# Patient Record
Sex: Female | Born: 1955 | Race: White | Hispanic: No | Smoking: Current every day smoker
Health system: Southern US, Community
[De-identification: ages and names within clinical notes are randomized; demographics above are authoritative.]

## PROBLEM LIST (undated history)

## (undated) DIAGNOSIS — M1990 Unspecified osteoarthritis, unspecified site: Secondary | ICD-10-CM

## (undated) DIAGNOSIS — I10 Essential (primary) hypertension: Secondary | ICD-10-CM

## (undated) DIAGNOSIS — F32A Depression, unspecified: Secondary | ICD-10-CM

## (undated) DIAGNOSIS — B20 Human immunodeficiency virus [HIV] disease: Secondary | ICD-10-CM

## (undated) DIAGNOSIS — F1910 Other psychoactive substance abuse, uncomplicated: Secondary | ICD-10-CM

## (undated) DIAGNOSIS — F419 Anxiety disorder, unspecified: Secondary | ICD-10-CM

## (undated) DIAGNOSIS — K759 Inflammatory liver disease, unspecified: Secondary | ICD-10-CM

## (undated) DIAGNOSIS — I219 Acute myocardial infarction, unspecified: Secondary | ICD-10-CM

## (undated) DIAGNOSIS — J449 Chronic obstructive pulmonary disease, unspecified: Secondary | ICD-10-CM

## (undated) DIAGNOSIS — N189 Chronic kidney disease, unspecified: Secondary | ICD-10-CM

## (undated) DIAGNOSIS — E785 Hyperlipidemia, unspecified: Secondary | ICD-10-CM

## (undated) DIAGNOSIS — J439 Emphysema, unspecified: Secondary | ICD-10-CM

## (undated) DIAGNOSIS — K219 Gastro-esophageal reflux disease without esophagitis: Secondary | ICD-10-CM

## (undated) DIAGNOSIS — R569 Unspecified convulsions: Secondary | ICD-10-CM

## (undated) DIAGNOSIS — F329 Major depressive disorder, single episode, unspecified: Secondary | ICD-10-CM

## (undated) DIAGNOSIS — Z21 Asymptomatic human immunodeficiency virus [HIV] infection status: Secondary | ICD-10-CM

## (undated) DIAGNOSIS — J45909 Unspecified asthma, uncomplicated: Secondary | ICD-10-CM

## (undated) DIAGNOSIS — I739 Peripheral vascular disease, unspecified: Secondary | ICD-10-CM

## (undated) HISTORY — DX: Anxiety disorder, unspecified: F41.9

## (undated) HISTORY — PX: TONSILLECTOMY: SUR1361

## (undated) HISTORY — PX: TUBAL LIGATION: SHX77

## (undated) HISTORY — DX: Gastro-esophageal reflux disease without esophagitis: K21.9

## (undated) HISTORY — DX: Unspecified osteoarthritis, unspecified site: M19.90

## (undated) HISTORY — DX: Hyperlipidemia, unspecified: E78.5

## (undated) HISTORY — DX: Essential (primary) hypertension: I10

## (undated) HISTORY — DX: Acute myocardial infarction, unspecified: I21.9

## (undated) HISTORY — DX: Major depressive disorder, single episode, unspecified: F32.9

## (undated) HISTORY — DX: Unspecified convulsions: R56.9

## (undated) HISTORY — DX: Depression, unspecified: F32.A

## (undated) HISTORY — PX: MYRINGOTOMY WITH TUBE PLACEMENT: SHX5663

## (undated) HISTORY — DX: Emphysema, unspecified: J43.9

## (undated) HISTORY — DX: Chronic obstructive pulmonary disease, unspecified: J44.9

## (undated) HISTORY — DX: Other psychoactive substance abuse, uncomplicated: F19.10

## (undated) HISTORY — PX: STENT PLACEMENT VASCULAR (ARMC HX): HXRAD1737

## (undated) HISTORY — DX: Asymptomatic human immunodeficiency virus (hiv) infection status: Z21

## (undated) HISTORY — DX: Unspecified asthma, uncomplicated: J45.909

## (undated) HISTORY — DX: Human immunodeficiency virus (HIV) disease: B20

---

## 2013-06-12 DIAGNOSIS — G894 Chronic pain syndrome: Secondary | ICD-10-CM | POA: Insufficient documentation

## 2013-06-12 DIAGNOSIS — M545 Low back pain, unspecified: Secondary | ICD-10-CM | POA: Insufficient documentation

## 2013-06-12 DIAGNOSIS — F1911 Other psychoactive substance abuse, in remission: Secondary | ICD-10-CM | POA: Insufficient documentation

## 2013-06-12 DIAGNOSIS — G8929 Other chronic pain: Secondary | ICD-10-CM | POA: Insufficient documentation

## 2013-06-19 DIAGNOSIS — B20 Human immunodeficiency virus [HIV] disease: Secondary | ICD-10-CM

## 2013-08-26 ENCOUNTER — Other Ambulatory Visit: Payer: Self-pay | Admitting: Internal Medicine

## 2013-08-26 DIAGNOSIS — C220 Liver cell carcinoma: Secondary | ICD-10-CM

## 2013-09-04 DIAGNOSIS — B20 Human immunodeficiency virus [HIV] disease: Secondary | ICD-10-CM

## 2013-12-04 ENCOUNTER — Other Ambulatory Visit: Payer: Self-pay | Admitting: Infectious Diseases

## 2013-12-10 DIAGNOSIS — B20 Human immunodeficiency virus [HIV] disease: Secondary | ICD-10-CM

## 2013-12-16 LAB — T-HELPER CELLS CD4/CD8 %
% CD 4 Pos. Lymph.: 44.6 % (ref 30.8–58.5)
Absolute CD 4 Helper: 580 /uL (ref 359–1519)
Basophils Absolute: 0.1 10*3/uL (ref 0.0–0.2)
Basos: 1 %
CD3+CD4+ Cells/CD3+CD8+ Cells Bld: 0.98 (ref 0.92–3.72)
CD3+CD8+ CELLS # BLD: 590 /uL (ref 109–897)
CD3+CD8+ Cells NFr Bld: 45.4 % — ABNORMAL HIGH (ref 12.0–35.5)
Eos: 5 %
Eosinophils Absolute: 0.3 10*3/uL (ref 0.0–0.4)
HCT: 46.1 % (ref 34.0–46.6)
HEMOGLOBIN: 16.6 g/dL — AB (ref 11.1–15.9)
IMMATURE GRANULOCYTES: 0 %
Immature Grans (Abs): 0 10*3/uL (ref 0.0–0.1)
LYMPHS ABS: 1.3 10*3/uL (ref 0.7–3.1)
Lymphs: 23 %
MCH: 35.4 pg — ABNORMAL HIGH (ref 26.6–33.0)
MCHC: 36 g/dL — ABNORMAL HIGH (ref 31.5–35.7)
MCV: 98 fL — AB (ref 79–97)
MONOCYTES: 9 %
Monocytes Absolute: 0.5 10*3/uL (ref 0.1–0.9)
Neutrophils Absolute: 3.4 10*3/uL (ref 1.4–7.0)
Neutrophils Relative %: 62 %
PLATELETS: 218 10*3/uL (ref 150–379)
RBC: 4.69 x10E6/uL (ref 3.77–5.28)
RDW: 14.1 % (ref 12.3–15.4)
WBC: 5.6 10*3/uL (ref 3.4–10.8)

## 2013-12-16 LAB — COMPREHENSIVE METABOLIC PANEL
ALT: 7 IU/L (ref 0–32)
AST: 18 IU/L (ref 0–40)
Albumin/Globulin Ratio: 1.5 (ref 1.1–2.5)
Albumin: 4.5 g/dL (ref 3.5–5.5)
Alkaline Phosphatase: 67 IU/L (ref 39–117)
BUN/Creatinine Ratio: 11 (ref 9–23)
BUN: 16 mg/dL (ref 6–24)
CALCIUM: 10.3 mg/dL — AB (ref 8.7–10.2)
CO2: 23 mmol/L (ref 18–29)
Chloride: 100 mmol/L (ref 97–108)
Creatinine, Ser: 1.44 mg/dL — ABNORMAL HIGH (ref 0.57–1.00)
GFR calc Af Amer: 46 mL/min/{1.73_m2} — ABNORMAL LOW (ref >59)
GFR calc non Af Amer: 40 mL/min/{1.73_m2} — ABNORMAL LOW (ref >59)
GLOBULIN, TOTAL: 3 g/dL (ref 1.5–4.5)
Glucose: 86 mg/dL (ref 65–99)
Potassium: 4.4 mmol/L (ref 3.5–5.2)
Sodium: 139 mmol/L (ref 134–144)
Total Bilirubin: 0.3 mg/dL (ref 0.0–1.2)
Total Protein: 7.5 g/dL (ref 6.0–8.5)

## 2013-12-16 LAB — RPR: SYPHILIS RPR SCR: NONREACTIVE

## 2013-12-16 LAB — RNA, B-DNA, QUANT

## 2013-12-16 LAB — LIPID PANEL WITH LDL/HDL RATIO
Cholesterol, Total: 229 mg/dL — ABNORMAL HIGH (ref 100–199)
HDL: 52 mg/dL (ref >39)
LDL Calculated: 134 mg/dL — ABNORMAL HIGH (ref 0–99)
LDl/HDL Ratio: 2.6 ratio units (ref 0.0–3.2)
Triglycerides: 214 mg/dL — ABNORMAL HIGH (ref 0–149)
VLDL Cholesterol Cal: 43 mg/dL — ABNORMAL HIGH (ref 5–40)

## 2013-12-29 ENCOUNTER — Other Ambulatory Visit: Payer: Self-pay | Admitting: Infectious Diseases

## 2014-01-20 ENCOUNTER — Other Ambulatory Visit: Payer: Self-pay | Admitting: Infectious Diseases

## 2014-02-08 ENCOUNTER — Other Ambulatory Visit: Payer: Self-pay | Admitting: Infectious Diseases

## 2014-02-18 ENCOUNTER — Other Ambulatory Visit: Payer: Self-pay | Admitting: Infectious Diseases

## 2014-03-09 ENCOUNTER — Other Ambulatory Visit: Payer: Self-pay | Admitting: Infectious Diseases

## 2014-06-10 DIAGNOSIS — B20 Human immunodeficiency virus [HIV] disease: Secondary | ICD-10-CM

## 2014-11-12 DIAGNOSIS — B20 Human immunodeficiency virus [HIV] disease: Secondary | ICD-10-CM

## 2014-11-19 ENCOUNTER — Encounter: Payer: Self-pay | Admitting: Infectious Disease

## 2015-04-30 DIAGNOSIS — B20 Human immunodeficiency virus [HIV] disease: Secondary | ICD-10-CM

## 2015-05-12 ENCOUNTER — Ambulatory Visit: Payer: Self-pay | Admitting: Podiatrist

## 2015-05-17 ENCOUNTER — Ambulatory Visit: Payer: Self-pay | Admitting: Podiatry

## 2015-05-17 ENCOUNTER — Ambulatory Visit: Payer: Self-pay | Admitting: Podiatrist

## 2015-05-24 ENCOUNTER — Ambulatory Visit: Payer: Self-pay | Admitting: Podiatry

## 2015-12-13 DIAGNOSIS — B20 Human immunodeficiency virus [HIV] disease: Secondary | ICD-10-CM

## 2015-12-22 ENCOUNTER — Ambulatory Visit (INDEPENDENT_AMBULATORY_CARE_PROVIDER_SITE_OTHER): Payer: PPO

## 2015-12-22 ENCOUNTER — Ambulatory Visit (INDEPENDENT_AMBULATORY_CARE_PROVIDER_SITE_OTHER): Payer: PPO | Admitting: Sports Medicine

## 2015-12-22 ENCOUNTER — Encounter: Payer: Self-pay | Admitting: Sports Medicine

## 2015-12-22 DIAGNOSIS — B351 Tinea unguium: Secondary | ICD-10-CM

## 2015-12-22 DIAGNOSIS — M216X9 Other acquired deformities of unspecified foot: Secondary | ICD-10-CM | POA: Diagnosis not present

## 2015-12-22 DIAGNOSIS — Z720 Tobacco use: Secondary | ICD-10-CM | POA: Insufficient documentation

## 2015-12-22 DIAGNOSIS — Q828 Other specified congenital malformations of skin: Secondary | ICD-10-CM

## 2015-12-22 DIAGNOSIS — L603 Nail dystrophy: Secondary | ICD-10-CM | POA: Diagnosis not present

## 2015-12-22 DIAGNOSIS — M79673 Pain in unspecified foot: Secondary | ICD-10-CM

## 2015-12-22 DIAGNOSIS — M21619 Bunion of unspecified foot: Secondary | ICD-10-CM

## 2015-12-22 DIAGNOSIS — M2040 Other hammer toe(s) (acquired), unspecified foot: Secondary | ICD-10-CM

## 2015-12-22 NOTE — Progress Notes (Deleted)
   Subjective:    Patient ID: Sheri Peterson, female    DOB: [birth year], 60 y.o.   MRN: [id number]  HPI    Review of Systems  All other systems reviewed and are negative.      Objective:   Physical Exam        Assessment & Plan:

## 2015-12-23 NOTE — Progress Notes (Signed)
Patient ID: Sheri Peterson, female   DOB: February 08, 1956, 60 y.o.   MRN: [id number]  Subjective: Sheri Peterson is a 60 y.o. female patient who presents to office for evaluation of bilateral foot pain. Patient complains of pain at the lesions present at the balls of both feet and also pain at nails; big toes; Patient states that this has been going on for years. Reports that she has a history of seeing previous orthopedic and foot doctors before for this condition and is well aware that her foot type is the reason why she has these calluses, from excessive pressure. States that she knows that these calluses will never go away until her foot is surgically corrected. Patient states that these areas feel better after trimming; sometimes she occasionally trims herself, however, has not been able to do so over the last several months. Patient denies any other pedal complaints.   Review of Systems  All other systems reviewed and are negative.  Patient Active Problem List   Diagnosis Date Noted  . Current tobacco use 12/22/2015  . Chronic pain associated with significant psychosocial dysfunction 06/12/2013  . H/O drug abuse 06/12/2013  . Chronic LBP 06/12/2013   No current outpatient prescriptions on file prior to visit.   No current facility-administered medications on file prior to visit.   Allergies  Allergen Reactions  . Morphine And Related Rash     Objective:  General: Alert and oriented x3 in no acute distress  Dermatology: Keratotic lesions present Sub-met 1 and 5 bilateral with a central nucleated core noted suggestive of porokeratosis, mild Plantar xerosis bilateral heels, no webspace macerations, no ecchymosis bilateral, all nails x 10 are are thickened and dystrophic with the most dystrophy and deformity noted at bilateral hallux nails that appear to have a ram's horn appearance with associated pain.   Vascular: Dorsalis Pedis 2/4 and Posterior Tibial pedal pulses 1/4, Capillary Fill  Time 3 seconds, + pedal hair growth bilateral, no edema bilateral lower extremities, Temperature gradient within normal limits.  Neurology: Johney Maine sensation intact via light touch bilateral.  Musculoskeletal: Moderate tenderness with palpation at bilateral hallux nails and keratosis sites, Muscular strength 5/5 in all groups without pain or limitation on range of motion. Pes cavus foot type with associated hammertoes and bunion bilateral.   Xrays  Right and left foot: Mild decrease osseous mineralization. Severe rigid pes cavus foot type with associated bunion and hammertoes. There is mild increase in density of the plantar fascia. No acute fractures, dislocations, no foreign body, soft tissues within normal limits.   Assessment and Plan: Problem List Items Addressed This Visit    None    Visit Diagnoses    Foot pain, unspecified laterality    -  Primary    Relevant Orders    DG Foot 2 Views Left    DG Foot 2 Views Right    Dystrophic nail        Porokeratosis        Bunion        Hammer toe, unspecified laterality        Pes cavus, unspecified laterality          -Complete examination performed -Xrays reviewed -Discussed treatement options -Parred keratoic lesions x 4 using a chisel blade; treated the area with Salinocaine and moleskin; patient may remove in one day. -Bilateral hallux nails mechanically debrided and trimmed all smoothed without complication using sterile nipper. -Recommend good supportive shoes and inserts for foot type -Recommend daily skin  emollients (O'Keefe healthy feet cream) -Patient to return to office in 2 months or sooner if condition worsens.  Landis Martins, DPM

## 2015-12-29 HISTORY — PX: CORONARY ARTERY BYPASS GRAFT: SHX141

## 2016-02-09 ENCOUNTER — Other Ambulatory Visit: Payer: Self-pay

## 2016-02-09 DIAGNOSIS — I25700 Atherosclerosis of coronary artery bypass graft(s), unspecified, with unstable angina pectoris: Secondary | ICD-10-CM

## 2016-02-09 DIAGNOSIS — Z79899 Other long term (current) drug therapy: Secondary | ICD-10-CM

## 2016-02-09 DIAGNOSIS — R296 Repeated falls: Secondary | ICD-10-CM

## 2016-02-09 NOTE — Patient Outreach (Signed)
[facility]) Care Management  02/09/2016  Sheri Peterson May 31, 1956 [id number]  SUBJECTIVE: Telephone call to patient regarding primary MD referral. HIPAA verified with patient. Discussed and offered [facility] care management services. Patient verbally agreed to receive services.  Patient states she was in the hospital from December 27, 2015 to January 31, 2016 due to heart attack and CABG. Patient states she was readmitted December 27, 2015 after being in the hospital the beginning of January 2016.  Patient states she lives alone but has a sponsor from [employer] that assists her with her care. Patient states she was discharged from the hospital on January 31, 2016 without home health, a shower chair, or walker. Patient states, " I have a bad leg. I have difficulty walking and sometimes I have sea legs which causes me to fall." Patient reports she has fallen 2x without injury since discharge from the hospital on 01/31/16.   Patient states she had a follow up visit with her primary MD on 02/07/16. Patient states her doctor ordered therapy with [facility] health but she is unable to afford the co-pays.  Patient states she has follow up visits scheduled with Dr. Adolph Pollack on 02/16/16 and Dr. Deanna Artis on 02/15/16, her cardiologist and cardiac surgeon.  Patient states she was suppose to be discharged with an order for a CPAP machine but was not. Patient states she is having some shortness of breath. Patient states she does have her inhaler and her nebulizer machine with medication.  Patient states she is unable to afford her patch for smoking. Patient states she will continue to smoke until she is able to get back on the patch.  Patient states she has her sponsor and [employer] for transportation assistance.  Patient states she is unable to afford all of her medications. Patient states the hospital discontinued some of the medications she was on prior to being admitted on 12/27/15.  RNCM advised  patient to contact her doctor for unusual symptoms. RNCM reviewed with patient signs of heart attack and advised patient to contact 911 for heart attack symptoms   ASSESSMENT: Primary MD referral by Scherrie Gerlach, PA due to recent hospital discharge for Myocardial infarction and CABG, Medication management, and assistance with obtaining supplies.  PLAN: RNCM referred patient to community and pharmacy.  Quinn Plowman RN,BSN,CCM [facility] Telephonic  [phone]

## 2016-02-10 ENCOUNTER — Other Ambulatory Visit: Payer: Self-pay

## 2016-02-10 NOTE — Patient Outreach (Signed)
New referral: Placed call to [facility] and spoke with Lelan Pons. Requested office visit and discharge noted from [facility].    Lelan Pons will fax to office.   3:40 pm.  Placed call to patient. Explained purpose of call.  Offered home visit for 3/22 and patient declined and states she has MD appointments on 3/21 and 3/22.  Offered home visit for 3/23. Patient accepted.    Inquired about insurance coverage as patient had reported that she can not afford home health copay. Patient reports that she has health team advantage and a "DSS" card which reads Medicaid. Patient then states that she is short of breath and I would have to call her tomorrow. Patient hung up the phone.   3:50pm Placed call to [facility] practice to see what kind of medicaid. It was confirmed that patient has medicaid.    4:00 Placed call to [facility] health and spoke with Loren Racer [phone]  who states that medicaid does not cover co pay for health team advantage.  No orders were ever received on this [facility] from [facility].    PLAN: Will call patient tomorrow as requested and establish care plan. Unable to establish goals as patient had to get off the phone.  Tomasa Rand, RN, BSN, CEN [facility] [employer] [phone]

## 2016-02-11 ENCOUNTER — Other Ambulatory Visit: Payer: Self-pay

## 2016-02-11 NOTE — Patient Outreach (Signed)
Follow up transition of care call.  Patient had requested a call back after she was unable to complete phone call yesterday. Placed call to patient who reports that she is doing okay.  States that she is not able to afford co pays for home health. Reviewed options with patient of choosing a different home health agency. Patient reports that she does not think she needs home health right now. Patient states that she needs a shower chair, ( currently using her brothers) and a walker.  Patient reports that she has all her medications except fenobritate and crestor.  Patient reports that she gets her prescriptions filled at [facility] on [address]. Patient states she will call and see if she has refills if so she will go pick up her meds. Patient states that she has money for her medication copays.  Patient unable to review each medication or doses at this time. Easily winded when talking for a long time on the phone.  Patient admits to going to emergency department last night with high blood pressure. States that she waited for 3 hours and decided to go home. Reports that her blood pressure was better when she left.  Patient reports that she thinks she needs CPAP at night. However does not have an order and has not had a sleep study. Encouraged patient to discuss with cardiologist next week at scheduled office visit.   Patient reports that she currently feels like she has everything she needs until my home visit on 02/17/2016.   Plan: Will see patient for home visit on 02/17/2016 as planned. Placed call to Lelan Pons at Dr. Nyra Capes office and provided update about home health and meds. Also spoke with Deanne Coffer about patient report of missing meds and her willingness to problem solve this herself. Patient to call back for concerns.   Tomasa Rand, RN, BSN, CEN [facility] [employer] [phone]

## 2016-02-17 ENCOUNTER — Other Ambulatory Visit: Payer: Self-pay

## 2016-02-17 VITALS — BP 118/80 | HR 80 | Resp 24 | Ht 67.0 in | Wt 139.0 lb

## 2016-02-17 DIAGNOSIS — Z951 Presence of aortocoronary bypass graft: Secondary | ICD-10-CM

## 2016-02-18 ENCOUNTER — Other Ambulatory Visit: Payer: Self-pay

## 2016-02-18 NOTE — Patient Outreach (Signed)
Sheri Peterson) Care Management   02/18/2016  Sheri Peterson [birth year] [id number]  Sheri Peterson is an 60 y.o. female 10:30 am  Arrived for scheduled home visit. Eduard Clos [facility] social worker included in home visit Subjective: Patient reports that she feels like she is doing well since hospital discharge on 02/01/2016. Reports that she does not remember a lot about her admission and why she was admitted for 2 months. Patient reports that she went to the [facility] Emergency Department with chest pain and was transferred to [facility] where she was told she had a heart attack. Reports that she had open heart surgery on 3 vessels.  Reports her sponsor was staying with her for a little while after discharge but now she is living alone. Reports social support is from her "Group Support and sponsor"  Patient reports that she does not socialize with any of her neighbors.  SMOKING: Patient reports that she stopped smoking 5 days ago. Reports calling the Quit Line and she got some patches. Patient plan to continue to not smoke. FOOD: Reports that she has food stamps and visits local food pantries if needed. Denies any concern for food securities. COPD: Patient reports that she was told by cardiology that she needed home oxygen. Reports that she did not get a prescription for oxygen. DME: Patient reports that she got a prescription for a shower chair and a rolling walker with seat. Reports that she was told she had to pay a 20 % copay.  Reports she does not have money for copay. MEDS: Patient reports that she has all her medications that are prescribed. Reports that her medications are cheap. Reports that she understands her medications. TRANSPORTATION: Reports that she uses [employer] for transportation needs. She knows who to call to get her rides scheduled. Reports that she is struggling going to see cardiac surgeon because [employer] does not go to [facility] when her MD is in the  office.   CABG: Reports chronic pain. Reports incision is well healed. Has follow up planned with cardiology. ( Dr. Joycelyn Rua [facility] physicians   [phone]. Last cardiology visit on 02/15/2016  Objective:  Awake and alert. Easily engaged. Home free of tripping hazards. Grab bar noted in shower. Large shower chair( patients brothers) in shower. Shower chair is too big for patients shower. Short of breath noted when patient ambulated to kitchen. She had to sit and rest.  Filed Vitals:   02/17/16 1058  BP: 118/80  Pulse: 80  Resp: 24  Height: 1.702 m (5' 7" )  Weight: 139 lb (63.05 kg)  SpO2: 95%   Review of Systems  Constitutional: Negative.   HENT: Negative.   Eyes: Negative.   Respiratory: Positive for cough, sputum production and shortness of breath.        Reports shortness of breath with ambulation  Cardiovascular: Negative for leg swelling.  Gastrointestinal: Negative.   Genitourinary: Negative.   Musculoskeletal: Positive for back pain and joint pain.  Skin: Negative.   Neurological: Negative.   Endo/Heme/Allergies: Negative.   Psychiatric/Behavioral: Positive for substance abuse. The patient is nervous/anxious.        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 Reports being anxious when taking a shower.    Physical Exam  Constitutional: She is oriented to person, place, and time. She appears well-developed and well-nourished.  Cardiovascular: Normal rate and normal heart sounds.   Respiratory: Effort normal and breath sounds normal. No respiratory distress. She has  no wheezes.  No distress. Lungs clear. Chest incisions well healed.   GI: Soft. Bowel sounds are normal.  Musculoskeletal: Normal range of motion. She exhibits no edema.  Lower back tender with palpation  Neurological: She is alert and oriented to person, place, and time.  Skin: Skin is warm and dry.  Incisions well healed. Feet without open sores or lesions.  Psychiatric: She has a normal mood and  affect. Her behavior is normal. Judgment and thought content normal.    Current Medications:   Current Outpatient Prescriptions  Medication Sig Dispense Refill  . abacavir-lamiVUDine (EPZICOM) 600-300 MG tablet Take by mouth. Reported on 02/09/2016    . acetaminophen (TYLENOL) 500 MG tablet Take 500 mg by mouth every 6 (six) hours as needed.    Marland Kitchen albuterol (PROVENTIL) (2.5 MG/3ML) 0.083% nebulizer solution Take 2.5 mg by nebulization every 6 (six) hours as needed for Wheezing.    Marland Kitchen aspirin EC 81 MG tablet Take 81 mg by mouth daily.    . diphenhydrAMINE (BENADRYL) 25 MG tablet Take 25 mg by mouth every 6 (six) hours as needed.    . doxepin (SINEQUAN) 50 MG capsule Reported on 02/09/2016  2  . FLUoxetine (PROZAC) 20 MG capsule TK ONE C PO  D  2  . HORIZANT 600 MG TBCR TK 1 T PO BID  6  . lisinopril (PRINIVIL,ZESTRIL) 40 MG tablet Take 40 mg by mouth 2 (two) times daily.    . metoprolol (LOPRESSOR) 50 MG tablet Take 50 mg by mouth 2 (two) times daily.    . nicotine (NICODERM CQ - DOSED IN MG/24 HOURS) 21 mg/24hr patch Place 21 mg onto the skin daily.    Marland Kitchen oxyCODONE-acetaminophen (ROXICET) 5-325 MG/5ML solution Take by mouth every 4 (four) hours as needed.    Marland Kitchen PROAIR HFA 108 (90 Base) MCG/ACT inhaler Reported on 02/09/2016  3  . ranitidine (ZANTAC) 150 MG tablet TK 1 T PO BID  1  . rosuvastatin (CRESTOR) 10 MG tablet Reported on 02/09/2016  2  . TRIUMEQ 600-50-300 MG tablet TK 1 T PO QD  11  . zolpidem (AMBIEN) 5 MG tablet Take 2.5 mg by mouth at bedtime as needed.    Marland Kitchen amLODipine (NORVASC) 10 MG tablet Reported on 02/17/2016  3  . amLODipine (NORVASC) 10 MG tablet Take by mouth. Reported on 02/17/2016    . Buprenorphine 15 MCG/HR PTWK Place onto the skin. Reported on 02/17/2016    . ciprofloxacin (CIPRO) 500 MG tablet Reported on 02/17/2016  0  . clopidogrel (PLAVIX) 75 MG tablet Reported on 02/17/2016  3  . dolutegravir (TIVICAY) 50 MG tablet Reported on 02/17/2016    . doxepin (SINEQUAN) 25 MG  capsule Take by mouth. Reported on 02/17/2016    . fenofibrate 160 MG tablet Reported on 02/17/2016  3  . fenofibrate 160 MG tablet Take by mouth. Reported on 02/17/2016    . FLUoxetine (PROZAC) 20 MG capsule Take by mouth. Reported on 02/17/2016    . metroNIDAZOLE (FLAGYL) 500 MG tablet Reported on 02/17/2016  0  . NUCYNTA ER 50 MG TB12 Reported on 02/17/2016  0  . ondansetron (ZOFRAN-ODT) 4 MG disintegrating tablet Reported on 02/17/2016  0  . perphenazine (TRILAFON) 4 MG tablet Reported on 02/17/2016    . pregabalin (LYRICA) 150 MG capsule Take by mouth. Reported on 02/17/2016    . promethazine-dextromethorphan (PROMETHAZINE-DM) 6.25-15 MG/5ML syrup Reported on 02/17/2016  0  . WELCHOL 625 MG tablet Reported on 02/17/2016  0  No current facility-administered medications for this visit.    Functional Status:   In your present state of health, do you have any difficulty performing the following activities: 02/17/2016  Hearing? Y  Vision? Y  Difficulty concentrating or making decisions? N  Walking or climbing stairs? Y  Dressing or bathing? Y  Doing errands, shopping? Y  Preparing Food and eating ? N  Using the Toilet? N  In the past six months, have you accidently leaked urine? Y  Do you have problems with loss of bowel control? Y  Managing your Medications? N  Managing your Finances? N  Housekeeping or managing your Housekeeping? N    Fall/Depression Screening:    PHQ 2/9 Scores 02/17/2016  PHQ - 2 Score 0   Fall Risk  02/17/2016 02/09/2016  Falls in the past year? Yes Yes  Number falls in past yr: 2 or more 2 or more  Injury with Fall? No No  Risk Factor Category  High Fall Risk High Fall Risk  Risk for fall due to : Impaired mobility;Impaired balance/gait History of fall(s)  Follow up Falls prevention discussed -    Assessment:   (1) reviewed purpose of [facility] program. Provided new patient packet. Provided [facility] calendar. Provided my contact card. Consent obtained. (2) no advanced  directive (3) Please note updated medication list. Denies problems with medications. (4) Stopped smoking 5 days ago. (5) Chronic pain (6) Lack of DME  ie shower chair and rolling walker. (7) Increased risk for falls. (8) transportation concern. Reports DSS case worker is Judyann Munson  [phone].  Plan:  (1) Consent scanned into chart. (2) Provided advanced directive packet during home visit. (3) encouraged patient to take all meds as prescribed. (4) Patient will continue to use nicotine patches. Encouraged patient to continue to stop smoking to improve her health. (5) Encouraged patient to keep scheduled [facility]th. (6) reviewed with patient options for DME company. Patient chooses [facility]. Placed call to [facility] home [facility] and provided patient information. Placed call to [facility] physicans and spoke with Gabby. Requested oxygen order, and DME orders to be faxed to [place] at [phone].  Patient informed to expect call.  (7) Reviewed fall precautions with patient. Working on DME. (8) referral placed for [facility] social worker who was present during this home visit and is familiar with patients needs.  Goal setting during home visit. Primary goal is to avoid readmissions. Patient will continue to get weekly outreach phone calls. Patient will call me sooner if needed. Placed call to Dr. Nyra Capes office and provide verbal update about patient. Will fax this note.    THN CM Care Plan Problem One        Most Recent Value   Care Plan Problem One  Recent admission related to heart problems/ CABG   Role Documenting the Problem One  Care Management Turner for Problem One  Active   THN Long Term Goal (31-90 days)  Patient will report no readmissions in the next 31 days.   THN Long Term Goal Start Date  02/11/16   Interventions for Problem One Long Term Goal  Encouraged patient to continue to follow up  with MD. encouraged patient to call MD for any problems   THN CM Short Term Goal #1 (0-30 days)  Patient will call pharmacy about her medications in the next 6 days   THN CM Short Term Goal #1 Start Date  02/11/16   THN CM Short Term Goal #1 Met Date  02/17/16 Barrie Folk met]   Interventions for Short Term Goal #1  encouraged patient to call pharmacy. if problems with medication enocuraged patient to call nurse case manager or primary MD. Infomred primary Md of medication concerns.   THN CM Short Term Goal #2 (0-30 days)  Patient will discuss need for CPAP with cardiologist as planned on 02/16/2016   [facility] CM Short Term Goal #2 Start Date  02/11/16   [facility] CM Short Term Goal #2 Met Date  02/17/16 Barrie Folk met]   Interventions for Short Term Goal #2  Enocuraged patient to discuss concerns with MD at planned office visit. Will follow up with patient at home visit planned for 02/17/2016   [facility] CM Short Term Goal #3 (0-30 days)  Patient will have needed DME in the next 7 days.   THN CM Short Term Goal #3 Start Date  02/10/16   Interventions for Short Tern Goal #3  Reviewed written prescription, placed call to [facility]. Placed call to cardiology and requested order to be faxed to [facility].     Tomasa Rand, RN, BSN, CEN [facility] [employer] [phone]

## 2016-02-18 NOTE — Patient Outreach (Signed)
Care Coordination: Spoke with Dahlia Client with [facility] home [facility].  Reports no orders received. Placed call to [facility] Card[facility]. Spoke with Janace Hoard again and requested order to be faxed to [facility]. Reports she will send message again. Note this is the second request. I requested a call back to confirm orders were faxed.  Tomasa Rand, RN, BSN, CEN [facility] [employer] [phone]

## 2016-02-22 ENCOUNTER — Other Ambulatory Visit: Payer: Self-pay

## 2016-02-22 NOTE — Patient Outreach (Signed)
Transition of care call:  Patient's friend Augustine Radar left me a voicemail  Requesting a call back.  Called patient back and spoke with patient. Patient very difficult to understand on the phone. Speech very different than when I completed home visit last week. Patient reports frequent falls in the last 2 days. Patient requested I speak with Augustine Radar.  I spoke with Augustine Radar who reports patient has had slurred speech, leaning to the right and 3 falls in the last 2 days.  PLAN: I encouraged patient to go to the emergency department for evaluation of change in condition. I placed call to MD office and inform Lelan Pons about symptoms. I requested Augustine Radar to call me back with update.  Will continue to follow.  Tomasa Rand, RN, BSN, CEN [facility] [employer] [phone]

## 2016-02-23 ENCOUNTER — Ambulatory Visit: Payer: PPO | Admitting: Sports Medicine

## 2016-02-24 ENCOUNTER — Other Ambulatory Visit: Payer: Self-pay

## 2016-02-24 ENCOUNTER — Other Ambulatory Visit: Payer: Self-pay | Admitting: *Deleted

## 2016-02-24 NOTE — Patient Outreach (Signed)
Transition of care call: Received voicemail from patient.  Placed called to patient who reports that she was diagnosed with a "mini stroke" reports she is feeling much better. Reports that she does not have her walker or shower chair yet and states that she has not heard from [address].  Placed call to [facility] and they report that no order was ever received. Placed call to [facility]) cardiology. Left a message for Chrisy. Requested order be re faxed to [facility].   Plan: Awaiting call back from cardiology,          Encouraged patient to make an appointment to be seen by primary MD.  4:50pm  Call back to advanced and confirm no order received today.               Placed call to patient and provided update.  Will contact patient when I hear back from MD.  Again encouraged patient to follow up with primary MD. She states she will call tomorrow for an appointment.  Tomasa Rand, RN, BSN, CEN [facility] [employer] [phone]

## 2016-02-24 NOTE — Patient Outreach (Signed)
Transition of care: Placed call to patient for follow up on ED visit. I reviewed [facility] EMR and noted that patient was discharged back home. No answer. Left a message requesting a call back.   PLAN: Will await a call back.  Tomasa Rand, RN, BSN, CEN [facility] [employer] [phone]

## 2016-02-25 ENCOUNTER — Other Ambulatory Visit: Payer: Self-pay

## 2016-02-25 NOTE — Patient Outreach (Signed)
Care Coordination: Received call back from [facility] cardiology, [place] who states order was faxed to [facility].  11:10am  Placed call to advanced and confirmed order was received. Advanced reports that they need demographics, diagnosis code, insurance information. I offered to supply this information but was told request was sent to MD office.  11:15am  Placed call back to [facility] cardiology and left a message for Chrissy to please take care of this asap as patient has been waiting a long time. Awaiting a call back.  Tomasa Rand, RN, BSN, CEN [facility] [employer] [phone]

## 2016-02-28 ENCOUNTER — Other Ambulatory Visit: Payer: Self-pay | Admitting: Pharmacist

## 2016-02-28 NOTE — Patient Outreach (Signed)
[place] [facility]) Care Management  [place]   02/28/2016  Sheri Peterson 01-04-1956 [id number]  Subjective: Sheri Peterson is a 60yo who was referred to [place] for medication review and medication assistance.  I received a return phone call from patient.  Patient reports filling all prescriptions at [facility] on [address] in [place], [place].  Patient denies any difficulty affording prescription medications.  Patient reports prescription copays are usually less than $4.  Patient states the only medication she had difficulty affording is nicotine patches and patient reports she was able to get nicotine patches from 1-800-QUITNOW.  Patient confirms the [place] program mailed her 2 weeks of patches.  Reviewed purpose and proper use of nicotine patches with patient.    Patient had recent hospitalization from 12/27/15 to 02/01/16 at [facility].  The following medications were initiated at discharge: aspirin 81 mg, lisinopril, metoprolol tartrate, nicotine patches, oxycodone-acetaminophen, and zolpidem.  The following medications were discontinued at discharge: amlodipine and clopidogrel.    Following discharge, patient had PCP visit on 02/07/16 and cardiology visit on 02/15/16.  Medication reconciliation completed using Care Everywhere records from hospital discharge on 01/31/16, medication list from patient's PCP visit on 02/07/16 (in Media tab) and cardiology visit on 02/15/16 (Care Everywhere).  I reviewed all medications with patient and counseled patient on purpose and proper use of medications.    Objective:   Encounter Medications: Outpatient Encounter Prescriptions as of 02/28/2016  Medication Sig Note  . acetaminophen (TYLENOL) 500 MG tablet Take 500 mg by mouth every 6 (six) hours as needed. 02/28/2016: Takes if needed.   Marland Kitchen albuterol (PROVENTIL) (2.5 MG/3ML) 0.083% nebulizer solution Take 2.5 mg by nebulization every 6 (six) hours as needed  for Wheezing. 02/28/2016: Has on hand if needed  . aspirin EC 81 MG tablet Take 81 mg by mouth daily.   . diphenhydrAMINE (BENADRYL) 25 MG tablet Take 25 mg by mouth every 6 (six) hours as needed. 02/28/2016: Has on hand if needed for itching  . doxepin (SINEQUAN) 50 MG capsule Reported on 02/09/2016 02/17/2016: Taking one at bedtime.  Marland Kitchen FLUoxetine (PROZAC) 20 MG capsule TK ONE C PO  D   . HORIZANT 600 MG TBCR TK 1 T PO BID   . lisinopril (PRINIVIL,ZESTRIL) 40 MG tablet Take 40 mg by mouth 2 (two) times daily.   . metoprolol (LOPRESSOR) 50 MG tablet Take 50 mg by mouth 2 (two) times daily.   . nicotine (NICODERM CQ - DOSED IN MG/24 HOURS) 21 mg/24hr patch Place 21 mg onto the skin daily.   Marland Kitchen oxyCODONE-acetaminophen (ROXICET) 5-325 MG/5ML solution Take by mouth every 4 (four) hours as needed. 02/28/2016: Reports taking as needed  . PROAIR HFA 108 (90 Base) MCG/ACT inhaler Reported on 02/09/2016   . ranitidine (ZANTAC) 150 MG tablet TK 1 T PO BID   . rosuvastatin (CRESTOR) 10 MG tablet Reported on 02/28/2016   . TRIUMEQ 600-50-300 MG tablet TK 1 T PO QD   . zolpidem (AMBIEN) 5 MG tablet Take 2.5 mg by mouth at bedtime as needed. 02/28/2016: Reports taking 1/2 of a 5 mg tablet as needed.   . [DISCONTINUED] abacavir-lamiVUDine (EPZICOM) 600-300 MG tablet Take by mouth. Reported on 02/09/2016 12/22/2015: Received from: [facility]  . [DISCONTINUED] amLODipine (NORVASC) 10 MG tablet Reported on 02/28/2016 02/28/2016: Provider discontinued  . [DISCONTINUED] amLODipine (NORVASC) 10 MG tablet Take by mouth. Reported on 02/28/2016 12/22/2015: Received from: [facility]  . [DISCONTINUED]  Buprenorphine 15 MCG/HR PTWK Place onto the skin. Reported on 02/28/2016 12/22/2015: Received from: [facility]  . [DISCONTINUED] ciprofloxacin (CIPRO) 500 MG tablet Reported on 02/28/2016 12/22/2015: Received from: External Pharmacy  . [DISCONTINUED] clopidogrel (PLAVIX) 75 MG tablet Reported on 02/28/2016 12/22/2015: Received from: External  Pharmacy  . [DISCONTINUED] dolutegravir (TIVICAY) 50 MG tablet Reported on 02/28/2016 12/22/2015: Received from: [facility]  . [DISCONTINUED] doxepin (SINEQUAN) 25 MG capsule Take by mouth. Reported on 02/17/2016 12/22/2015: Received from: [facility]  . [DISCONTINUED] fenofibrate 160 MG tablet Reported on 02/28/2016 12/22/2015: Received from: External Pharmacy  . [DISCONTINUED] fenofibrate 160 MG tablet Take by mouth. Reported on 02/28/2016 12/22/2015: Received from: [facility]  . [DISCONTINUED] FLUoxetine (PROZAC) 20 MG capsule Take by mouth. Reported on 02/17/2016 12/22/2015: Received from: [facility]  . [DISCONTINUED] metroNIDAZOLE (FLAGYL) 500 MG tablet Reported on 02/28/2016 12/22/2015: Received from: External Pharmacy  . [DISCONTINUED] NUCYNTA ER 50 MG TB12 Reported on 02/28/2016 12/22/2015: Received from: External Pharmacy  . [DISCONTINUED] ondansetron (ZOFRAN-ODT) 4 MG disintegrating tablet Reported on 02/28/2016 12/22/2015: Received from: External Pharmacy  . [DISCONTINUED] perphenazine (TRILAFON) 4 MG tablet Reported on 02/28/2016 12/22/2015: Received from: [facility]  . [DISCONTINUED] pregabalin (LYRICA) 150 MG capsule Take by mouth. Reported on 02/28/2016 12/22/2015: Received from: [facility]  . [DISCONTINUED] promethazine-dextromethorphan (PROMETHAZINE-DM) 6.25-15 MG/5ML syrup Reported on 02/28/2016 12/22/2015: Received from: External Pharmacy  . [DISCONTINUED] [facility] 625 MG tablet Reported on 02/28/2016 12/22/2015: Received from: External Pharmacy   No facility-administered encounter medications on file as of 02/28/2016.    Functional Status: In your present state of health, do you have any difficulty performing the following activities: 02/17/2016  Hearing? Y  Vision? Y  Difficulty concentrating or making decisions? N  Walking or climbing stairs? Y  Dressing or bathing? Y  Doing errands, shopping? Y  Preparing Food and eating ? N  Using the Toilet? N  In the past six months, have you  accidently leaked urine? Y  Do you have problems with loss of bowel control? Y  Managing your Medications? N  Managing your Finances? N  Housekeeping or managing your Housekeeping? N    Fall/Depression Screening: PHQ 2/9 Scores 02/17/2016  PHQ - 2 Score 0    Assessment:  Drugs sorted by system:  Neurologic/Psychologic: doxepin, fluoxetine, zolpidem  Cardiovascular: aspirin, lisinopirl, metoprolol, rosuvastatin  Pulmonary/Allergy: albuterol HFA and nebulizer, diphenhydramine  Gastrointestinal: ranitidine  Endocrine: none  Renal: none  Topical: nicotine patch   Pain: acetaminophen, gabapentin, oxycodone-acetaminophen  Vitamins/Minerals: none  Infectious Diseases: Triumeq  Miscellaneous: none   Duplications in therapy: none noted  Gaps in therapy: none noted   Medications to avoid in the elderly: N/A, patient <65yo  Drug interactions:  Fluoxetine is an inhibitor of CYP2D6 which may decrease the metabolism of the following medications:  - Doxepin:  Dose reduction of doxepin may be needed during concurrent administration of fluoxetine.  Additional plasma drug concentration and clinical monitoring are indicated.    - Metoprolol:  Co-administration of fluoxetine and metoprolol may increase the risk of bradycardia and hypotension.  Close heart rate and blood pressure monitoring are indicated when fluoxetine and metoprolol are coadministered. Alternatively, consider use of SSRIs that have a lower potential to inhibit CYP2D6 (e.g. citalopram, escitalopram, sertraline) in patients receiving metoprolol tartrate.  Other issues noted:  - Medication adherence with rosuvastatin.  Patient reports adherence with all medications except rosuvastatin.  Patient reports she is currently out of the medication and when she tried to get it refilled from  [place] there were no refills available.  Patient report [facility] contacted her provider for a refill request but she has not  heard back from [facility] about refill.  I called [facility] and confirmed that patient now has a refill available for rosuvastatin.  I updated patient on this information.  Patient confirms she will go pick up rosuvastatin prescription today.  I advised patient to call [facility] next time to check on status of prescriptions instead of waiting for them to call her back.  Patient voices understanding.   - Long acting inhaler for COPD.  Per review of PCP records, medication list includes Breo Ellipta and Incruse Ellipta.  Patient reports she is not currently taking these medications as they were not on her hospital discharge medication list.  Patient reports she still has the inhalers but there are no more doses in the Breo Ellipta inhaler and only 3 doses in the Incruse Ellipta inhaler.     Plan: 1.  I will send the findings of my medication review to patient's PCP, Scherrie Gerlach.  Will request for clarification from PCP whether patient should resume using Breo Ellipta and Incruse Ellipta inhalers.   2.  Will close pharmacy program as no further intervention needed.  Will update [place].     Elisabeth Most, Pharm.D. Pharmacy Resident [place] [phone]

## 2016-02-28 NOTE — Patient Outreach (Signed)
Error

## 2016-02-28 NOTE — Patient Outreach (Signed)
[facility]) Care Management  02/28/2016  Sheri Peterson September 14, 1956 [id number]   Sheri Peterson is a 60yo who was referred to [place] for medication review and medication assistance.  I made outreach call to patient to review her medications and assess for medication assistance needs.  There was no answer.  I left a HIPAA compliant voicemail for patient to return my call.  I will make outreach call on 02/29/16 if patient does not return my call today.    Elisabeth Most, Pharm.D. Pharmacy Resident [place] [phone]

## 2016-02-29 ENCOUNTER — Encounter: Payer: Self-pay | Admitting: *Deleted

## 2016-02-29 ENCOUNTER — Encounter: Payer: Self-pay | Admitting: Pharmacist

## 2016-02-29 NOTE — Patient Outreach (Signed)
[facility]) Care Management  02/29/2016  Sheri Peterson [birth year] [id number]  CSW contacted patient to assess need for CSW involvement/visit.Referred to CSW for transportation and Medicaid.  Patient reports to CSW that she already has full Medicaid. She also reports that she is linked with [facility] for transportation- she has her Sponsor taking her to an appointment that she has in [place] (cardiologist) which was at a time and on a day that [facility] cannot go out of town/county.  Patient denies any other psychosocial needs or concerns. She is still attempting to get some DME and reports that she is linked with [facility] who is helping with DME and transportation needs.  CSW will close case at this time. Patient aware and encouraged to call CSW if needs arise.  CSW will notify [facility] RN CM of case closure.     Eduard Clos, MSW, [place] Worker  [place] [phone]

## 2016-03-06 ENCOUNTER — Other Ambulatory Visit: Payer: Self-pay

## 2016-03-06 NOTE — Patient Outreach (Signed)
Transition of care call: Spoke with patient who reports that she had follow up with MD today. Reports that she did get a call from [facility] and will have her shower chair and walker delivered today.   Reports that she has been referred to [facility] and is waiting on assessment.  Reports that she is feeling much better. Voice sounds strong on the phone.  PLAN: Will continue transition of care calls weekly.  Tomasa Rand, RN, BSN, CEN [facility] [employer] [phone]

## 2016-03-08 ENCOUNTER — Ambulatory Visit (INDEPENDENT_AMBULATORY_CARE_PROVIDER_SITE_OTHER): Payer: PPO | Admitting: Sports Medicine

## 2016-03-08 ENCOUNTER — Encounter: Payer: Self-pay | Admitting: Sports Medicine

## 2016-03-08 VITALS — BP 98/62 | HR 63 | Resp 16

## 2016-03-08 DIAGNOSIS — M216X9 Other acquired deformities of unspecified foot: Secondary | ICD-10-CM

## 2016-03-08 DIAGNOSIS — L853 Xerosis cutis: Secondary | ICD-10-CM

## 2016-03-08 DIAGNOSIS — M21619 Bunion of unspecified foot: Secondary | ICD-10-CM

## 2016-03-08 DIAGNOSIS — M2040 Other hammer toe(s) (acquired), unspecified foot: Secondary | ICD-10-CM | POA: Diagnosis not present

## 2016-03-08 DIAGNOSIS — M79673 Pain in unspecified foot: Secondary | ICD-10-CM | POA: Diagnosis not present

## 2016-03-08 DIAGNOSIS — Q828 Other specified congenital malformations of skin: Secondary | ICD-10-CM | POA: Diagnosis not present

## 2016-03-08 NOTE — Progress Notes (Signed)
Patient ID: Sheri Peterson, female   DOB: [birth year], 60 y.o.   MRN: [id number]  Subjective: Sheri Peterson is a 60 y.o. female patient who returns to office for evaluation of bilateral foot pain secondary to callus and dry skin. Reports that she has been in the hospital, had a triple bypass surgery and has just recently been released. States that her skin has started to peel and scale and wanted to have this evaluated. States that she has not been able to pick up the skin cream that I recommend at last visit because of her hospitalization. Patient denies any other pedal complaints.    Patient Active Problem List   Diagnosis Date Noted  . Current tobacco use 12/22/2015  . Chronic pain associated with significant psychosocial dysfunction 06/12/2013  . H/O drug abuse 06/12/2013  . Chronic LBP 06/12/2013   Current Outpatient Prescriptions on File Prior to Visit  Medication Sig Dispense Refill  . acetaminophen (TYLENOL) 500 MG tablet Take 500 mg by mouth every 6 (six) hours as needed.    Marland Kitchen albuterol (PROVENTIL) (2.5 MG/3ML) 0.083% nebulizer solution Take 2.5 mg by nebulization every 6 (six) hours as needed for Wheezing.    Marland Kitchen aspirin EC 81 MG tablet Take 81 mg by mouth daily.    . diphenhydrAMINE (BENADRYL) 25 MG tablet Take 25 mg by mouth every 6 (six) hours as needed.    . doxepin (SINEQUAN) 50 MG capsule Reported on 02/09/2016  2  . FLUoxetine (PROZAC) 20 MG capsule TK ONE C PO  D  2  . HORIZANT 600 MG TBCR TK 1 T PO BID  6  . lisinopril (PRINIVIL,ZESTRIL) 40 MG tablet Take 40 mg by mouth 2 (two) times daily.    . metoprolol (LOPRESSOR) 50 MG tablet Take 50 mg by mouth 2 (two) times daily.    . nicotine (NICODERM CQ - DOSED IN MG/24 HOURS) 21 mg/24hr patch Place 21 mg onto the skin daily.    Marland Kitchen oxyCODONE-acetaminophen (ROXICET) 5-325 MG/5ML solution Take by mouth every 4 (four) hours as needed.    Marland Kitchen PROAIR HFA 108 (90 Base) MCG/ACT inhaler Reported on 02/09/2016  3  . ranitidine (ZANTAC) 150 MG  tablet TK 1 T PO BID  1  . rosuvastatin (CRESTOR) 10 MG tablet Reported on 02/28/2016  2  . TRIUMEQ 600-50-300 MG tablet TK 1 T PO QD  11  . zolpidem (AMBIEN) 5 MG tablet Take 2.5 mg by mouth at bedtime as needed.     No current facility-administered medications on file prior to visit.   Allergies  Allergen Reactions  . Morphine And Related Rash     Objective:  General: Alert and oriented x3 in no acute distress  Dermatology: Keratotic lesions present Sub-met 1 and 5 bilateral with a central nucleated core noted suggestive of porokeratosis, mild Plantar xerosis bilateral heels, no webspace macerations, no ecchymosis bilateral, all nails x 10 are are thickened and dystrophic but well manicured.   Vascular: Dorsalis Pedis 2/4 and Posterior Tibial pedal pulses 1/4, Capillary Fill Time 3 seconds, + pedal hair growth bilateral, no edema bilateral lower extremities, Temperature gradient within normal limits.  Neurology: Sheri Peterson sensation intact via light touch bilateral.  Musculoskeletal: Moderate tenderness with palpation at keratosis sites, Muscular strength 5/5 in all groups without pain or limitation on range of motion. Pes cavus foot type with associated hammertoes and bunion bilateral.   Assessment and Plan: Problem List Items Addressed This Visit    None  Visit Diagnoses    Porokeratosis    -  Primary    Dry skin        Foot pain, unspecified laterality        Bunion        Hammer toe, unspecified laterality        Pes cavus, unspecified laterality          -Complete examination performed -Discussed treatement options -Parred keratoic lesions x 4 using a chisel blade without incident. -Recommend good supportive shoes and inserts for foot type -Recommend daily skin emollients (O'Keefe healthy feet cream) -Patient to return to office as needed or sooner if condition worsens.  Landis Martins, DPM

## 2016-03-13 ENCOUNTER — Ambulatory Visit: Payer: PPO

## 2016-03-13 ENCOUNTER — Ambulatory Visit: Payer: Self-pay

## 2016-03-14 ENCOUNTER — Other Ambulatory Visit: Payer: Self-pay

## 2016-03-14 NOTE — Patient Outreach (Signed)
Final transition of care call/case closure: Spoke with patient who reports that she is doing well. Denies any new problems or concerns. Reports that [facility] assessment has been completed and she hopes to start with the [facility] center in May.  Discussed with patient that she has successfully completed transition of care program. Patient reports that she did follow up with [facility] and her walker and shower chair have been shipped. Patient denies any new needs or concerns and is in agreement to case closure at this time.  PLAN: Successfully completed transition of care. Will close case and notify MD and patient by letter.  THN CM Care Plan Problem One        Most Recent Value   Care Plan Problem One  Recent admission related to heart problems/ CABG   Role Documenting the Problem One  Care Management [place] for Problem One  Active   THN Long Term Goal (31-90 days)  Patient will report no readmissions in the next 31 days.   THN Long Term Goal Start Date  02/11/16   [facility] Long Term Goal Met Date  03/14/16 Barrie Folk met]   Interventions for Problem One Long Term Goal  Encouraged patient to continue to reports any concerns to MD.   [facility] CM Short Term Goal #1 (0-30 days)  Patient will call pharmacy about her medications in the next 6 days   THN CM Short Term Goal #1 Start Date  02/11/16   [facility] CM Short Term Goal #1 Met Date  02/17/16 Barrie Folk met]   Interventions for Short Term Goal #1  encouraged patient to call pharmacy. if problems with medication enocuraged patient to call nurse case manager or primary MD. Infomred primary Md of medication concerns.   [facility]N CM Short Term Goal #2 (0-30 days)  Patient will discuss need for CPAP with cardiologist as planned on 02/16/2016   [facility] CM Short Term Goal #2 Start Date  02/11/16   [facility] CM Short Term Goal #2 Met Date  02/17/16 Barrie Folk met]   Interventions for Short Term Goal #2  Enocuraged patient to discuss concerns with MD at planned office  visit. Will follow up with patient at home visit planned for 02/17/2016   [facility] CM Short Term Goal #3 (0-30 days)  Patient will have needed DME in the next 7 days.   [facility] CM Short Term Goal #3 Start Date  02/10/16   [facility] CM Short Term Goal #3 Met Date  03/06/16 [goal met. Reports getting equipment today]   Interventions for Short Tern Goal #3  placed call to [facility] and cardiology office.      Tomasa Rand, RN, BSN, CEN [facility] [employer] [phone]

## 2016-04-10 ENCOUNTER — Other Ambulatory Visit: Payer: Self-pay | Admitting: Family Medicine

## 2016-04-10 DIAGNOSIS — M79604 Pain in right leg: Secondary | ICD-10-CM

## 2016-04-10 DIAGNOSIS — M79605 Pain in left leg: Principal | ICD-10-CM

## 2016-04-14 DIAGNOSIS — B20 Human immunodeficiency virus [HIV] disease: Secondary | ICD-10-CM | POA: Diagnosis not present

## 2016-04-27 ENCOUNTER — Other Ambulatory Visit: Payer: PPO

## 2016-05-08 ENCOUNTER — Encounter: Payer: Self-pay | Admitting: Surgery

## 2016-05-15 ENCOUNTER — Ambulatory Visit (INDEPENDENT_AMBULATORY_CARE_PROVIDER_SITE_OTHER): Payer: Medicare (Managed Care) | Admitting: Surgery

## 2016-05-15 ENCOUNTER — Encounter: Payer: Self-pay | Admitting: Surgery

## 2016-05-15 VITALS — BP 97/50 | HR 89 | Ht 67.0 in | Wt 137.0 lb

## 2016-05-15 DIAGNOSIS — I70213 Atherosclerosis of native arteries of extremities with intermittent claudication, bilateral legs: Secondary | ICD-10-CM | POA: Diagnosis not present

## 2016-05-15 NOTE — Progress Notes (Signed)
[facility] [place]  Patient name: Sheri Peterson MRN: [id number] DOB: May 10, 1956 Sex: female  REFERRING PHYSICIAN: [place]: PAD  HPI: Sheri Peterson is a 60 y.o. female, who is referred for evaluation of bilateral claudication the patient states that she will fall down because her legs give out with minimal activity.  She is requiring a walker currently.  She does not have any rest pain or nonhealing wounds.  Her most recent ABIs are 0.44 on the right and 0.39 on the left.  CT angiogram shows a focal occlusion in the left iliac artery and a long segment occlusion of the right iliac artery.  The patient reports having undergone percutaneous intervention in the past in another state.  She appears to have occlusion of the distal left SFA as well as a short segment stenosis within the right SFA.  The patient suffers from HIV which is currently undetectable on anti-retroviral therapy.  She also has hepatitis C.  She takes a statin for hypercholesterolemia.  She is on multiple medications for blood pressure.  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  Past Medical History  Diagnosis Date  . Anxiety   . Arthritis   . Asthma   . COPD (chronic obstructive pulmonary disease) ([place])   . Depression   . Emphysema of lung ([place])   . GERD (gastroesophageal reflux disease)   . HIV infection ([place])   . Hyperlipidemia   . Hypertension   . Myocardial infarction ([place])   . Seizures ([place])   . Substance abuse     No family history on file.  SOCIAL HISTORY: Social History   Social History  . Marital Status: Widowed    Spouse Name: N/A  . Number of Children: N/A  . Years of Education: N/A   Occupational History  . Not on file.   Social History Main Topics  . Smoking status: Former Smoker -- 0.50 packs/day for 46 years    Types: Cigarettes     Quit date: 02/12/2016  . Smokeless tobacco: Never Used  . Alcohol Use: No  . Drug Use: No  . Sexual Activity: Not on file   Other Topics Concern  . Not on file   Social History Narrative    Allergies  Allergen Reactions  . Morphine And Related Rash    Current Outpatient Prescriptions  Medication Sig Dispense Refill  . acetaminophen (TYLENOL) 500 MG tablet Take 500 mg by mouth every 6 (six) hours as needed.    Marland Kitchen albuterol (PROVENTIL) (2.5 MG/3ML) 0.083% nebulizer solution Take 2.5 mg by nebulization every 6 (six) hours as needed for Wheezing.    Marland Kitchen aspirin EC 81 MG tablet Take 81 mg by mouth daily.    . diphenhydrAMINE (BENADRYL) 25 MG tablet Take 25 mg by mouth every 6 (six) hours as needed.    . doxepin (SINEQUAN) 50 MG capsule Reported on 02/09/2016  2  . FLUoxetine (PROZAC) 20 MG capsule TK ONE C PO  D  2  . HORIZANT 600 MG TBCR TK 1 T PO BID  6  . metoprolol (LOPRESSOR) 50 MG tablet Take 25 mg by mouth 2 (two) times daily.     . nicotine (NICODERM CQ - DOSED IN MG/24 HOURS) 21 mg/24hr patch Place 21 mg onto the skin daily.    Marland Kitchen PROAIR HFA 108 (90 Base) MCG/ACT inhaler Reported on 02/09/2016  3  .  ranitidine (ZANTAC) 150 MG tablet TK 1 T PO BID  1  . rosuvastatin (CRESTOR) 10 MG tablet Reported on 02/28/2016  2  . TRIUMEQ 600-50-300 MG tablet TK 1 T PO QD  11  . clopidogrel (PLAVIX) 75 MG tablet Take 75 mg by mouth daily. Reported on 05/15/2016  0  . lisinopril (PRINIVIL,ZESTRIL) 40 MG tablet Take 40 mg by mouth 2 (two) times daily. Reported on 05/15/2016    . oxyCODONE-acetaminophen (ROXICET) 5-325 MG/5ML solution Take by mouth every 4 (four) hours as needed. Reported on 05/15/2016    . zolpidem (AMBIEN) 5 MG tablet Take 2.5 mg by mouth at bedtime as needed. Reported on 05/15/2016     No current facility-administered medications for this visit.    REVIEW OF SYSTEMS:  [X]  denotes positive finding, [ ]  denotes negative finding Cardiac  Comments:  Chest pain or chest pressure:  x   Shortness of breath upon exertion:    Short of breath when lying flat:    Irregular heart rhythm:        Vascular    Pain in calf, thigh, or hip brought on by ambulation: x   Pain in feet at night that wakes you up from your sleep:  x   Blood clot in your veins:    Leg swelling:  x       Pulmonary    Oxygen at home:    Productive cough:     Wheezing:         Neurologic    Sudden weakness in arms or legs:     Sudden numbness in arms or legs:     Sudden onset of difficulty speaking or slurred speech:    Temporary loss of vision in one eye:     Problems with dizziness:         Gastrointestinal    Blood in stool:     Vomited blood:         Genitourinary    Burning when urinating:     Blood in urine:        Psychiatric    Major depression:         Hematologic    Bleeding problems:    Problems with blood clotting too easily:        Skin    Rashes or ulcers:        Constitutional    Fever or chills:      PHYSICAL EXAM: Filed Vitals:   05/15/16 0916  BP: 97/50  Pulse: 89  Height: 5\' 7"  (1.702 m)  Weight: 137 lb (62.143 kg)  SpO2: 99%    GENERAL: The patient is a well-nourished female, in no acute distress. The vital signs are documented above. CARDIAC: There is a regular rate and rhythm.  VASCULAR: Nonpalpable pedal pulse PULMONARY: There is good air exchange bilaterally without wheezing or rales. ABDOMEN: Soft and non-tender with normal pitched bowel sounds.  MUSCULOSKELETAL: There are no major deformities or cyanosis. NEUROLOGIC: No focal weakness or paresthesias are detected. SKIN: There are no ulcers or rashes noted. PSYCHIATRIC: The patient has a normal affect.  DATA:  ABI: 0.44 on the right.  0.39 on the left. CT angiogram: Occluded left iliac artery.  Occluded right common and external iliac artery.  Bilateral SFA occlusion  MEDICAL ISSUES: The patient is suffering from significant lower extremity vascular insufficiency.  She has significant  inflow disease.  We have discussed proceeding with angiography via a left femoral approach to evaluate the  stenosis/occlusion in the left common iliac artery.  I will try to revascularize this area so that she would be a candidate for a left to right femoral-femoral bypass graft.  If I cannot open her left iliac, I would need to consider an aortobifemoral bypass graft.  Her catheterization is been scheduled for Wednesday, July 5.  I will keep her on aspirin and Plavix until then.  She will need to come off her Plavix for her surgical procedure.  The risks and benefits of the procedure were discussed with the patient all her questions were answered.  I also discussed the case with Dr. Narda Bonds, MD [facility] [phone] Pager [phone]

## 2016-05-19 ENCOUNTER — Other Ambulatory Visit: Payer: Self-pay

## 2016-05-31 ENCOUNTER — Encounter (HOSPITAL_COMMUNITY): Admission: RE | Payer: Self-pay | Source: Ambulatory Visit

## 2016-05-31 ENCOUNTER — Other Ambulatory Visit: Payer: Self-pay

## 2016-05-31 ENCOUNTER — Ambulatory Visit (HOSPITAL_COMMUNITY): Admission: RE | Admit: 2016-05-31 | Payer: Medicare (Managed Care) | Source: Ambulatory Visit | Admitting: Surgery

## 2016-05-31 SURGERY — ABDOMINAL AORTOGRAM
Anesthesia: LOCAL

## 2016-06-13 ENCOUNTER — Observation Stay (HOSPITAL_COMMUNITY)
Admission: RE | Admit: 2016-06-13 | Discharge: 2016-06-14 | Disposition: A | Discharge status: Home / Self Care | Payer: Medicare (Managed Care) | Source: Ambulatory Visit | Attending: Surgery | Admitting: Surgery

## 2016-06-13 ENCOUNTER — Encounter (HOSPITAL_COMMUNITY)
Admission: RE | Disposition: A | Discharge status: Home / Self Care | Payer: Self-pay | Source: Ambulatory Visit | Attending: Surgery

## 2016-06-13 ENCOUNTER — Encounter (HOSPITAL_COMMUNITY): Payer: Self-pay | Admitting: *Deleted

## 2016-06-13 DIAGNOSIS — J449 Chronic obstructive pulmonary disease, unspecified: Secondary | ICD-10-CM | POA: Diagnosis not present

## 2016-06-13 DIAGNOSIS — B1920 Unspecified viral hepatitis C without hepatic coma: Secondary | ICD-10-CM | POA: Diagnosis not present

## 2016-06-13 DIAGNOSIS — E785 Hyperlipidemia, unspecified: Secondary | ICD-10-CM | POA: Insufficient documentation

## 2016-06-13 DIAGNOSIS — Z7982 Long term (current) use of aspirin: Secondary | ICD-10-CM | POA: Diagnosis not present

## 2016-06-13 DIAGNOSIS — I70213 Atherosclerosis of native arteries of extremities with intermittent claudication, bilateral legs: Secondary | ICD-10-CM | POA: Diagnosis not present

## 2016-06-13 DIAGNOSIS — I739 Peripheral vascular disease, unspecified: Secondary | ICD-10-CM | POA: Diagnosis present

## 2016-06-13 DIAGNOSIS — Z951 Presence of aortocoronary bypass graft: Secondary | ICD-10-CM | POA: Diagnosis not present

## 2016-06-13 DIAGNOSIS — Z9181 History of falling: Secondary | ICD-10-CM | POA: Diagnosis not present

## 2016-06-13 DIAGNOSIS — Z87891 Personal history of nicotine dependence: Secondary | ICD-10-CM | POA: Insufficient documentation

## 2016-06-13 DIAGNOSIS — Z21 Asymptomatic human immunodeficiency virus [HIV] infection status: Secondary | ICD-10-CM | POA: Insufficient documentation

## 2016-06-13 DIAGNOSIS — K219 Gastro-esophageal reflux disease without esophagitis: Secondary | ICD-10-CM | POA: Insufficient documentation

## 2016-06-13 DIAGNOSIS — I10 Essential (primary) hypertension: Secondary | ICD-10-CM | POA: Insufficient documentation

## 2016-06-13 DIAGNOSIS — E7800 Pure hypercholesterolemia, unspecified: Secondary | ICD-10-CM | POA: Insufficient documentation

## 2016-06-13 DIAGNOSIS — I252 Old myocardial infarction: Secondary | ICD-10-CM | POA: Insufficient documentation

## 2016-06-13 DIAGNOSIS — F419 Anxiety disorder, unspecified: Secondary | ICD-10-CM | POA: Insufficient documentation

## 2016-06-13 DIAGNOSIS — Z79899 Other long term (current) drug therapy: Secondary | ICD-10-CM | POA: Insufficient documentation

## 2016-06-13 DIAGNOSIS — Z7902 Long term (current) use of antithrombotics/antiplatelets: Secondary | ICD-10-CM | POA: Insufficient documentation

## 2016-06-13 HISTORY — PX: PERIPHERAL VASCULAR CATHETERIZATION: SHX172C

## 2016-06-13 HISTORY — DX: Inflammatory liver disease, unspecified: K75.9

## 2016-06-13 LAB — POCT I-STAT, CHEM 8
BUN: 45 mg/dL — ABNORMAL HIGH (ref 6–20)
CREATININE: 1.5 mg/dL — AB (ref 0.44–1.00)
Calcium, Ion: 1.28 mmol/L — ABNORMAL HIGH (ref 1.12–1.23)
Chloride: 110 mmol/L (ref 101–111)
GLUCOSE: 94 mg/dL (ref 65–99)
HCT: 47 % — ABNORMAL HIGH (ref 36.0–46.0)
HEMOGLOBIN: 16 g/dL — AB (ref 12.0–15.0)
Potassium: 5.8 mmol/L — ABNORMAL HIGH (ref 3.5–5.1)
Sodium: 141 mmol/L (ref 135–145)
TCO2: 23 mmol/L (ref 0–100)

## 2016-06-13 SURGERY — LOWER EXTREMITY ANGIOGRAPHY

## 2016-06-13 MED ORDER — SODIUM CHLORIDE 0.9 % IV SOLN: INTRAVENOUS | Status: DC

## 2016-06-13 MED ORDER — LIDOCAINE HCL (PF) 1 % IJ SOLN
INTRAMUSCULAR | Status: DC | PRN
  Administered 2016-06-13: 12 mL

## 2016-06-13 MED ORDER — HYDROMORPHONE HCL 1 MG/ML IJ SOLN
0.5000 mg | INTRAMUSCULAR | Status: DC | PRN
  Administered 2016-06-13: 0.5 mg via INTRAVENOUS

## 2016-06-13 MED ORDER — GUAIFENESIN-DM 100-10 MG/5ML PO SYRP: 15.0000 mL | ORAL_SOLUTION | ORAL | Status: DC | PRN

## 2016-06-13 MED ORDER — FAMOTIDINE 20 MG PO TABS
20.0000 mg | ORAL_TABLET | Freq: Every day | ORAL | Status: DC
  Filled 2016-06-13: qty 1

## 2016-06-13 MED ORDER — IODIXANOL 320 MG/ML IV SOLN
INTRAVENOUS | Status: DC | PRN
  Administered 2016-06-13: 5 mL via INTRAVENOUS

## 2016-06-13 MED ORDER — OXYCODONE HCL 5 MG PO TABS
5.0000 mg | ORAL_TABLET | ORAL | Status: DC | PRN
  Administered 2016-06-14 (×2): 10 mg via ORAL
  Filled 2016-06-13 (×2): qty 2

## 2016-06-13 MED ORDER — PHENOL 1.4 % MT LIQD: 1.0000 | OROMUCOSAL | Status: DC | PRN

## 2016-06-13 MED ORDER — ACETAMINOPHEN 325 MG PO TABS: 650.0000 mg | ORAL_TABLET | Freq: Four times a day (QID) | ORAL | Status: DC | PRN

## 2016-06-13 MED ORDER — ACETAMINOPHEN 325 MG RE SUPP: 325.0000 mg | RECTAL | Status: DC | PRN

## 2016-06-13 MED ORDER — HEPARIN (PORCINE) IN NACL 2-0.9 UNIT/ML-% IJ SOLN
INTRAMUSCULAR | Status: DC | PRN
  Administered 2016-06-13: 1000 mL

## 2016-06-13 MED ORDER — FENTANYL CITRATE (PF) 100 MCG/2ML IJ SOLN
INTRAMUSCULAR | Status: DC | PRN
  Administered 2016-06-13 (×2): 25 ug via INTRAVENOUS

## 2016-06-13 MED ORDER — METOPROLOL TARTRATE 5 MG/5ML IV SOLN: 2.0000 mg | INTRAVENOUS | Status: DC | PRN

## 2016-06-13 MED ORDER — CLOPIDOGREL BISULFATE 75 MG PO TABS
75.0000 mg | ORAL_TABLET | Freq: Every day | ORAL | Status: DC
  Administered 2016-06-14: 75 mg via ORAL
  Filled 2016-06-13: qty 1

## 2016-06-13 MED ORDER — ROSUVASTATIN CALCIUM 10 MG PO TABS
10.0000 mg | ORAL_TABLET | Freq: Every day | ORAL | Status: DC
  Administered 2016-06-13 – 2016-06-14 (×2): 10 mg via ORAL
  Filled 2016-06-13 (×2): qty 1

## 2016-06-13 MED ORDER — ABACAVIR-DOLUTEGRAVIR-LAMIVUD 600-50-300 MG PO TABS
1.0000 | ORAL_TABLET | Freq: Every day | ORAL | Status: DC
  Filled 2016-06-13 (×2): qty 1

## 2016-06-13 MED ORDER — LISINOPRIL 40 MG PO TABS: 40.0000 mg | ORAL_TABLET | Freq: Two times a day (BID) | ORAL | Status: DC

## 2016-06-13 MED ORDER — MIDAZOLAM HCL 2 MG/2ML IJ SOLN
INTRAMUSCULAR | Status: AC
  Filled 2016-06-13: qty 2

## 2016-06-13 MED ORDER — FLUOXETINE HCL 20 MG PO CAPS
20.0000 mg | ORAL_CAPSULE | Freq: Every day | ORAL | Status: DC
  Administered 2016-06-13 – 2016-06-14 (×2): 20 mg via ORAL
  Filled 2016-06-13 (×4): qty 1

## 2016-06-13 MED ORDER — METHYLPREDNISOLONE 4 MG PO TABS
4.0000 mg | ORAL_TABLET | Freq: Two times a day (BID) | ORAL | Status: DC
  Administered 2016-06-13 (×2): 4 mg via ORAL
  Filled 2016-06-13 (×3): qty 1

## 2016-06-13 MED ORDER — MIDAZOLAM HCL 2 MG/2ML IJ SOLN
INTRAMUSCULAR | Status: DC | PRN
  Administered 2016-06-13 (×2): 1 mg via INTRAVENOUS

## 2016-06-13 MED ORDER — HEPARIN (PORCINE) IN NACL 2-0.9 UNIT/ML-% IJ SOLN
INTRAMUSCULAR | Status: AC
  Filled 2016-06-13: qty 1000

## 2016-06-13 MED ORDER — ACETAMINOPHEN 325 MG PO TABS: 325.0000 mg | ORAL_TABLET | ORAL | Status: DC | PRN

## 2016-06-13 MED ORDER — ASPIRIN EC 81 MG PO TBEC
81.0000 mg | DELAYED_RELEASE_TABLET | Freq: Every day | ORAL | Status: DC
  Administered 2016-06-14: 81 mg via ORAL
  Filled 2016-06-13: qty 1

## 2016-06-13 MED ORDER — GABAPENTIN 600 MG PO TABS
600.0000 mg | ORAL_TABLET | Freq: Three times a day (TID) | ORAL | Status: DC
  Administered 2016-06-13 – 2016-06-14 (×3): 600 mg via ORAL
  Filled 2016-06-13 (×3): qty 1

## 2016-06-13 MED ORDER — FENTANYL CITRATE (PF) 100 MCG/2ML IJ SOLN
INTRAMUSCULAR | Status: AC
  Filled 2016-06-13: qty 2

## 2016-06-13 MED ORDER — LIDOCAINE HCL (PF) 1 % IJ SOLN
INTRAMUSCULAR | Status: AC
  Filled 2016-06-13: qty 30

## 2016-06-13 MED ORDER — DOXEPIN HCL 50 MG PO CAPS
50.0000 mg | ORAL_CAPSULE | Freq: Every day | ORAL | Status: DC
  Administered 2016-06-13: 50 mg via ORAL
  Filled 2016-06-13 (×2): qty 1

## 2016-06-13 MED ORDER — HYDRALAZINE HCL 20 MG/ML IJ SOLN: 5.0000 mg | INTRAMUSCULAR | Status: DC | PRN

## 2016-06-13 MED ORDER — DOCUSATE SODIUM 100 MG PO CAPS
100.0000 mg | ORAL_CAPSULE | Freq: Every day | ORAL | Status: DC
  Filled 2016-06-13 (×2): qty 1

## 2016-06-13 MED ORDER — HEPARIN SODIUM (PORCINE) 1000 UNIT/ML IJ SOLN
INTRAMUSCULAR | Status: AC
  Filled 2016-06-13: qty 1

## 2016-06-13 MED ORDER — ONDANSETRON HCL 4 MG/2ML IJ SOLN: 4.0000 mg | Freq: Four times a day (QID) | INTRAMUSCULAR | Status: DC | PRN

## 2016-06-13 MED ORDER — LABETALOL HCL 5 MG/ML IV SOLN: 10.0000 mg | INTRAVENOUS | Status: DC | PRN

## 2016-06-13 MED ORDER — SODIUM CHLORIDE 0.9 % IV SOLN
INTRAVENOUS | Status: DC
  Administered 2016-06-13: 07:00:00 via INTRAVENOUS

## 2016-06-13 MED ORDER — VITAMIN D 1000 UNITS PO TABS
5000.0000 [IU] | ORAL_TABLET | Freq: Every day | ORAL | Status: DC
  Administered 2016-06-14: 5000 [IU] via ORAL
  Filled 2016-06-13 (×2): qty 5

## 2016-06-13 MED ORDER — ALUM & MAG HYDROXIDE-SIMETH 200-200-20 MG/5ML PO SUSP: 15.0000 mL | ORAL | Status: DC | PRN

## 2016-06-13 MED ORDER — HYDROMORPHONE HCL 1 MG/ML IJ SOLN
INTRAMUSCULAR | Status: AC
  Filled 2016-06-13: qty 1

## 2016-06-13 MED ORDER — VITAMIN D3 125 MCG (5000 UT) PO TABS: 5000.0000 [IU] | ORAL_TABLET | Freq: Every day | ORAL | Status: DC

## 2016-06-13 SURGICAL SUPPLY — 14 items
CATH ANGIO 5F BER2 65CM (CATHETERS) ×3 IMPLANT
CATH OMNI FLUSH 5F 65CM (CATHETERS) ×3 IMPLANT
DEVICE TORQUE H2O (MISCELLANEOUS) ×3 IMPLANT
DRAPE ZERO GRAVITY STERILE (DRAPES) ×3 IMPLANT
GUIDEWIRE ANGLED .035X150CM (WIRE) ×3 IMPLANT
KIT MICROINTRODUCER STIFF 5F (SHEATH) ×3 IMPLANT
KIT PV (KITS) ×3 IMPLANT
SHEATH PINNACLE 5F 10CM (SHEATH) ×3 IMPLANT
SHIELD RADPAD SCOOP 12X17 (MISCELLANEOUS) ×3 IMPLANT
SYR MEDRAD MARK V 150ML (SYRINGE) ×3 IMPLANT
TRANSDUCER W/STOPCOCK (MISCELLANEOUS) ×3 IMPLANT
TRAY PV CATH (CUSTOM PROCEDURE TRAY) ×3 IMPLANT
WIRE BENTSON .035X145CM (WIRE) ×3 IMPLANT
WIRE ROSEN-J .035X180CM (WIRE) ×3 IMPLANT

## 2016-06-13 NOTE — Progress Notes (Signed)
Site area: lt groin Site Prior to Removal:  Level 0 Pressure Applied For:  20 minutes Manual:   1055 Patient Status During Pull:  yes Post Pull Site:  Level  0 Post Pull Instructions Given:  yes Post Pull Pulses Present:  Lt DP dopplered faintly after sheath pull Dressing Applied:  tegaderm Bedrest begins @  1055 Comments:

## 2016-06-13 NOTE — Progress Notes (Signed)
Waiting on room assignment. Eating [place] sandwich

## 2016-06-13 NOTE — Op Note (Signed)
    Patient name: Sheri Peterson MRN: [id number] DOB: June 18, 1956 Sex: female  06/13/2016 Pre-operative Diagnosis: Bilateral lower extremity claudication Post-operative diagnosis:  Same Surgeon:  [name] Procedure Performed:  1.  Ultrasound-guided access, left femoral artery  2.  Left lower extremity angiogram  3.  Conscious sedation 27 minutes     Indications:  The patient has severe bilateral peripheral vascular disease.  A CT scan shows an occluded right common and external iliac artery and an occluded short segments of the left common iliac artery.  She comes in today for attempted revascularization.  Procedure:  The patient was identified in the holding area and taken to room 8.  The patient was then placed supine on the table and prepped and draped in the usual sterile fashion.  A time out was called.  Conscious sedation was administered with the use of IV fentanyl and Versed.  This was done under continuous physician and circulating nurse monitoring were present for the entire portion of the procedure.  Heart rate blood pressure and oxygen levels were continuously monitored.  Ultrasound was used to evaluate the left common femoral artery.  It was patent .  A digital ultrasound image was acquired.  A micropuncture needle was used to access the left common femoral artery under ultrasound guidance.  An 018 wire was advanced without resistance and a micropuncture sheath was placed.  I then advanced a Rosen wire until I met resistance in the iliac artery and placed a 5 French sheath.  I performed a retrograde injection through the sheath which shows a dissection plane through the common and external iliac artery and complete occlusion.  I then used a Berenstein 2 catheter and a Glidewire to try to gain entry into the aorta.  I was never able to get the catheter into the true lumen.  I and up performing a retrograde injection through the sheath which showed that there did appear to be a lumen  into the aorta but there was a significant amount of thrombus present in the common iliac and distal aorta.  At this point I felt that intervention or proceeding would be hazardous and that I should terminate the procedure.  Catheters and wires were removed.  The patient be taken the holding area for sheath pull once her coagulation profile corrects.  Impression:  #1  occlusion of bilateral iliac arteries which are unable to be recanalized  #2  patient will be considered for surgical revascularization    V. [name], M.D. [facility] [place] Office: [phone] Pager:  [phone]

## 2016-06-13 NOTE — H&P (View-Only) (Signed)
V[facility] and [facility]in Specialist of [place]  Patient name: Sheri Peterson MRN: [id number] DOB: 1956/09/04 Sex: female  REFERRING PHYSICIAN: [place]: PAD  HPI: Sheri Peterson is a 60 y.o. female, who is referred for evaluation of bilateral claudication the patient states that she will fall down because her legs give out with minimal activity.  She is requiring a walker currently.  She does not have any rest pain or nonhealing wounds.  Her most recent ABIs are 0.44 on the right and 0.39 on the left.  CT angiogram shows a focal occlusion in the left iliac artery and a long segment occlusion of the right iliac artery.  The patient reports having undergone percutaneous intervention in the past in another state.  She appears to have occlusion of the distal left SFA as well as a short segment stenosis within the right SFA.  The patient suffers from HIV which is currently undetectable on anti-retroviral therapy.  She also has hepatitis C.  She takes a statin for hypercholesterolemia.  She is on multiple medications for blood pressure.  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  Past Medical History  Diagnosis Date  . Anxiety   . Arthritis   . Asthma   . COPD (chronic obstructive pulmonary disease) ([place])   . Depression   . Emphysema of lung ([place])   . GERD (gastroesophageal reflux disease)   . HIV infection ([place])   . Hyperlipidemia   . Hypertension   . Myocardial infarction ([place])   . Seizures ([place])   . Substance abuse     No family history on file.  SOCIAL HISTORY: Social History   Social History  . Marital Status: Widowed    Spouse Name: N/A  . Number of Children: N/A  . Years of Education: N/A   Occupational History  . Not on file.   Social History Main Topics  . Smoking status: Former Smoker -- 0.50 packs/day for 46 years    Types: Cigarettes     Quit date: 02/12/2016  . Smokeless tobacco: Never Used  . Alcohol Use: No  . Drug Use: No  . Sexual Activity: Not on file   Other Topics Concern  . Not on file   Social History Narrative    Allergies  Allergen Reactions  . Morphine And Related Rash    Current Outpatient Prescriptions  Medication Sig Dispense Refill  . acetaminophen (TYLENOL) 500 MG tablet Take 500 mg by mouth every 6 (six) hours as needed.    Marland Kitchen albuterol (PROVENTIL) (2.5 MG/3ML) 0.083% nebulizer solution Take 2.5 mg by nebulization every 6 (six) hours as needed for Wheezing.    Marland Kitchen aspirin EC 81 MG tablet Take 81 mg by mouth daily.    . diphenhydrAMINE (BENADRYL) 25 MG tablet Take 25 mg by mouth every 6 (six) hours as needed.    . doxepin (SINEQUAN) 50 MG capsule Reported on 02/09/2016  2  . FLUoxetine (PROZAC) 20 MG capsule TK ONE C PO  D  2  . HORIZANT 600 MG TBCR TK 1 T PO BID  6  . metoprolol (LOPRESSOR) 50 MG tablet Take 25 mg by mouth 2 (two) times daily.     . nicotine (NICODERM CQ - DOSED IN MG/24 HOURS) 21 mg/24hr patch Place 21 mg onto the skin daily.    Marland Kitchen PROAIR HFA 108 (90 Base) MCG/ACT inhaler Reported on 02/09/2016  3  .  ranitidine (ZANTAC) 150 MG tablet TK 1 T PO BID  1  . rosuvastatin (CRESTOR) 10 MG tablet Reported on 02/28/2016  2  . TRIUMEQ 600-50-300 MG tablet TK 1 T PO QD  11  . clopidogrel (PLAVIX) 75 MG tablet Take 75 mg by mouth daily. Reported on 05/15/2016  0  . lisinopril (PRINIVIL,ZESTRIL) 40 MG tablet Take 40 mg by mouth 2 (two) times daily. Reported on 05/15/2016    . oxyCODONE-acetaminophen (ROXICET) 5-325 MG/5ML solution Take by mouth every 4 (four) hours as needed. Reported on 05/15/2016    . zolpidem (AMBIEN) 5 MG tablet Take 2.5 mg by mouth at bedtime as needed. Reported on 05/15/2016     No current facility-administered medications for this visit.    REVIEW OF SYSTEMS:  [X]  denotes positive finding, [ ]  denotes negative finding Cardiac  Comments:  Chest pain or chest pressure:  x   Shortness of breath upon exertion:    Short of breath when lying flat:    Irregular heart rhythm:        Vascular    Pain in calf, thigh, or hip brought on by ambulation: x   Pain in feet at night that wakes you up from your sleep:  x   Blood clot in your veins:    Leg swelling:  x       Pulmonary    Oxygen at home:    Productive cough:     Wheezing:         Neurologic    Sudden weakness in arms or legs:     Sudden numbness in arms or legs:     Sudden onset of difficulty speaking or slurred speech:    Temporary loss of vision in one eye:     Problems with dizziness:         Gastrointestinal    Blood in stool:     Vomited blood:         Genitourinary    Burning when urinating:     Blood in urine:        Psychiatric    Major depression:         Hematologic    Bleeding problems:    Problems with blood clotting too easily:        Skin    Rashes or ulcers:        Constitutional    Fever or chills:      PHYSICAL EXAM: Filed Vitals:   05/15/16 0916  BP: 97/50  Pulse: 89  Height: 5\' 7"  (1.702 m)  Weight: 137 lb (62.143 kg)  SpO2: 99%    GENERAL: The patient is a well-nourished female, in no acute distress. The vital signs are documented above. CARDIAC: There is a regular rate and rhythm.  VASCULAR: Nonpalpable pedal pulse PULMONARY: There is good air exchange bilaterally without wheezing or rales. ABDOMEN: Soft and non-tender with normal pitched bowel sounds.  MUSCULOSKELETAL: There are no major deformities or cyanosis. NEUROLOGIC: No focal weakness or paresthesias are detected. SKIN: There are no ulcers or rashes noted. PSYCHIATRIC: The patient has a normal affect.  DATA:  ABI: 0.44 on the right.  0.39 on the left. CT angiogram: Occluded left iliac artery.  Occluded right common and external iliac artery.  Bilateral SFA occlusion  MEDICAL ISSUES: The patient is suffering from significant lower extremity vascular insufficiency.  She has significant  inflow disease.  We have discussed proceeding with angiography via a left femoral approach to evaluate the  stenosis/occlusion in the left common iliac artery.  I will try to revascularize this area so that she would be a candidate for a left to right femoral-femoral bypass graft.  If I cannot open her left iliac, I would need to consider an aortobifemoral bypass graft.  Her catheterization is been scheduled for Wednesday, July 5.  I will keep her on aspirin and Plavix until then.  She will need to come off her Plavix for her surgical procedure.  The risks and benefits of the procedure were discussed with the patient all her questions were answered.  I also discussed the case with Dr. Narda Bonds, MD [facility] [phone] Pager [phone]

## 2016-06-13 NOTE — Interval H&P Note (Signed)
History and Physical Interval Note:  06/13/2016 6:56 AM  Sheri Peterson  has presented today for surgery, with the diagnosis of PVD  The various methods of treatment have been discussed with the patient and family. After consideration of risks, benefits and other options for treatment, the patient has consented to  Procedure(s): Abdominal Aortogram w/ Bilateral Lower Extremity (N/A) as a surgical intervention .  The patient's history has been reviewed, patient examined, no change in status, stable for surgery.  I have reviewed the patient's chart and labs.  Questions were answered to the patient's satisfaction.     [name]

## 2016-06-14 ENCOUNTER — Observation Stay (HOSPITAL_COMMUNITY): Payer: Medicare (Managed Care)

## 2016-06-14 ENCOUNTER — Encounter (HOSPITAL_COMMUNITY): Payer: Self-pay | Admitting: Surgery

## 2016-06-14 ENCOUNTER — Other Ambulatory Visit: Payer: Self-pay | Admitting: *Deleted

## 2016-06-14 DIAGNOSIS — I739 Peripheral vascular disease, unspecified: Secondary | ICD-10-CM

## 2016-06-14 DIAGNOSIS — I70213 Atherosclerosis of native arteries of extremities with intermittent claudication, bilateral legs: Secondary | ICD-10-CM | POA: Diagnosis not present

## 2016-06-14 DIAGNOSIS — Z01810 Encounter for preprocedural cardiovascular examination: Secondary | ICD-10-CM

## 2016-06-14 LAB — BASIC METABOLIC PANEL
ANION GAP: 6 (ref 5–15)
BUN: 33 mg/dL — ABNORMAL HIGH (ref 6–20)
CO2: 22 mmol/L (ref 22–32)
Calcium: 9.2 mg/dL (ref 8.9–10.3)
Chloride: 108 mmol/L (ref 101–111)
Creatinine, Ser: 1.22 mg/dL — ABNORMAL HIGH (ref 0.44–1.00)
GFR, EST AFRICAN AMERICAN: 54 mL/min — AB (ref >60)
GFR, EST NON AFRICAN AMERICAN: 47 mL/min — AB (ref >60)
GLUCOSE: 113 mg/dL — AB (ref 65–99)
POTASSIUM: 6.1 mmol/L — AB (ref 3.5–5.1)
Sodium: 136 mmol/L (ref 135–145)

## 2016-06-14 LAB — CBC
HEMATOCRIT: 43.3 % (ref 36.0–46.0)
Hemoglobin: 13.9 g/dL (ref 12.0–15.0)
MCH: 33.5 pg (ref 26.0–34.0)
MCHC: 32.1 g/dL (ref 30.0–36.0)
MCV: 104.3 fL — AB (ref 78.0–100.0)
Platelets: 179 10*3/uL (ref 150–400)
RBC: 4.15 MIL/uL (ref 3.87–5.11)
RDW: 15.1 % (ref 11.5–15.5)
WBC: 9.4 10*3/uL (ref 4.0–10.5)

## 2016-06-14 MED ORDER — IOPAMIDOL (ISOVUE-370) INJECTION 76%
INTRAVENOUS | Status: AC
Start: 2016-06-14 — End: 2016-06-14
  Administered 2016-06-14: 100 mL
  Filled 2016-06-14: qty 100

## 2016-06-14 MED ORDER — METHYLPREDNISOLONE 4 MG PO TABS
4.0000 mg | ORAL_TABLET | Freq: Two times a day (BID) | ORAL | Status: DC
  Administered 2016-06-14: 4 mg via ORAL
  Filled 2016-06-14 (×2): qty 1

## 2016-06-14 MED FILL — Heparin Sodium (Porcine) Inj 1000 Unit/ML: INTRAMUSCULAR | Qty: 10 | Status: AC

## 2016-06-14 NOTE — Progress Notes (Signed)
  Progress Note    06/14/2016 8:35 AM 1 Day Post-Op  Subjective:  C/o pain in her left great toe and dorsum of foot  Afebrile HR  80's NSR [id number] systolic [id number] RA  Filed Vitals:   06/13/16 2038 06/14/16 0545  BP: 142/69 101/59  Pulse: 82 80  Temp: 98.3 F (36.8 C) 98 F (36.7 C)  Resp: 16 16    Physical Exam: Extremities:  Pt c/o soreness left great toe; no sore or wound is visible.   CBC    Component Value Date/Time   WBC 9.4 06/14/2016 0420   WBC 5.6 12/04/2013 1105   RBC 4.15 06/14/2016 0420   RBC 4.69 12/04/2013 1105   HGB 13.9 06/14/2016 0420   HCT 43.3 06/14/2016 0420   PLT 179 06/14/2016 0420   MCV 104.3* 06/14/2016 0420   MCH 33.5 06/14/2016 0420   MCH 35.4* 12/04/2013 1105   MCHC 32.1 06/14/2016 0420   MCHC 36.0* 12/04/2013 1105   RDW 15.1 06/14/2016 0420   RDW 14.1 12/04/2013 1105   LYMPHSABS 1.3 12/04/2013 1105   EOSABS 0.3 12/04/2013 1105   BASOSABS 0.1 12/04/2013 1105    BMET    Component Value Date/Time   NA 136 06/14/2016 0420   NA 139 12/04/2013 1105   K 6.1* 06/14/2016 0420   CL 108 06/14/2016 0420   CO2 22 06/14/2016 0420   GLUCOSE 113* 06/14/2016 0420   GLUCOSE 86 12/04/2013 1105   BUN 33* 06/14/2016 0420   BUN 16 12/04/2013 1105   CREATININE 1.22* 06/14/2016 0420   CALCIUM 9.2 06/14/2016 0420   GFRNONAA 47* 06/14/2016 0420   GFRAA 54* 06/14/2016 0420    INR No results found for: INR  No intake or output data in the 24 hours ending 06/14/16 0835   Assessment:  60 y.o. female is s/p:  1. Ultrasound-guided access, left femoral artery 2. Left lower extremity angiogram 3. Conscious sedation 27 minutes  1 Day Post-Op  Plan: -will get CTA of chest/abdomen/pelvis today to determine next step whether it be axillary-bifemoral bypass grafting or aortobifemoral bypass grafting. -will discharge home after scan. -she has an appointment with Dr. Agustin Cree (cardiologist in [place]) next  week as she will need cardiac clearance for surgery.  Dr. Arita Miss will see her back the following week.  She may also need pulmonary clearance for surgery as well.    Leontine Locket, PA-C [facility] [phone] 06/14/2016 8:35 AM    Patient will need CT angiogram of the chest to evaluate bilateral subclavian and axillary arteries for possible axillary bifemoral bypass.  I discussed would also consider an aortobifemoral bypass graft, however she will have to have formal clearance from cardiology and pulmonary since she is recently status post CABG and wears oxygen at night.  After she gets her CT scan, she can be discharged home.  She has a scheduled appoint with her cardiologist next week.  I will see her back in 2 weeks.  [name]

## 2016-06-14 NOTE — Progress Notes (Signed)
Patient alert and oriented X 4 and in no distress.  Patient given discharge instructions regarding signs and symptoms to report, medications, diet, activity, and upcoming appointments.  Patient verbalized understanding of all instructions.  Telemetry and peripheral IV discontinued. Patient confirmed that she had all of her personal belongings. Patient transported out via wheelchair by NT.

## 2016-06-14 NOTE — Progress Notes (Signed)
Chaplain asked nurse to give AD paperwork. When it is completed by Pt chaplain notarize it.   Thanks

## 2016-06-15 ENCOUNTER — Telehealth: Payer: Self-pay | Admitting: Surgery

## 2016-06-15 ENCOUNTER — Encounter: Payer: Self-pay | Admitting: Surgery

## 2016-06-15 NOTE — Telephone Encounter (Signed)
Sched appt 7/31; lab at 2:00 and MD 2:45. Lm on hm# to inform pt.

## 2016-06-15 NOTE — Telephone Encounter (Signed)
Mena Goes, RN  P [facility] Admin Pool             Previous Messages     ----- Message -----   From: Gabriel Earing, PA-C   Sent: 06/14/2016  8:50 AM    To: [facility] Charge Pool   Pt will need an appointment with Dr. Trula Slade the week of 7/31 (she is seeing cardiologist next week and will need to see brabham after that).   Thanks,  [place] Bar

## 2016-06-15 NOTE — Telephone Encounter (Signed)
-----   Message from Mena Goes, RN sent at 06/14/2016  9:36 AM EDT ----- Regarding: Needs lab and F/U w/ [facility]    ----- Message -----    From: Serafina Mitchell, MD    Sent: 06/14/2016   9:22 AM      To: [facility] Charge Pool  06/14/2016: Follow-up hospital visit  Patient is scheduled to see her cardiologist in [place] on the 26th.  I would like to see her back after that for consideration of axillary bifemoral bypass graft versus aortobifemoral bypass graft.  She will need carotid Doppler studies prior to her visit

## 2016-06-18 ENCOUNTER — Inpatient Hospital Stay (HOSPITAL_COMMUNITY): Payer: Medicare (Managed Care)

## 2016-06-18 ENCOUNTER — Inpatient Hospital Stay (HOSPITAL_COMMUNITY)
Admission: EM | Admit: 2016-06-18 | Discharge: 2016-06-24 | DRG: 252 | Disposition: A | Discharge status: Home Health | Payer: Medicare (Managed Care) | Source: Other Acute Inpatient Hospital | Attending: Internal Medicine | Admitting: Internal Medicine

## 2016-06-18 ENCOUNTER — Encounter (HOSPITAL_COMMUNITY): Payer: Self-pay | Admitting: Family Medicine

## 2016-06-18 ENCOUNTER — Telehealth: Payer: Self-pay | Admitting: Internal Medicine

## 2016-06-18 DIAGNOSIS — E875 Hyperkalemia: Secondary | ICD-10-CM | POA: Diagnosis not present

## 2016-06-18 DIAGNOSIS — N179 Acute kidney failure, unspecified: Secondary | ICD-10-CM | POA: Diagnosis not present

## 2016-06-18 DIAGNOSIS — F419 Anxiety disorder, unspecified: Secondary | ICD-10-CM | POA: Diagnosis present

## 2016-06-18 DIAGNOSIS — I998 Other disorder of circulatory system: Secondary | ICD-10-CM

## 2016-06-18 DIAGNOSIS — D539 Nutritional anemia, unspecified: Secondary | ICD-10-CM | POA: Diagnosis present

## 2016-06-18 DIAGNOSIS — Z7982 Long term (current) use of aspirin: Secondary | ICD-10-CM | POA: Diagnosis not present

## 2016-06-18 DIAGNOSIS — J449 Chronic obstructive pulmonary disease, unspecified: Secondary | ICD-10-CM | POA: Diagnosis present

## 2016-06-18 DIAGNOSIS — B20 Human immunodeficiency virus [HIV] disease: Secondary | ICD-10-CM | POA: Diagnosis present

## 2016-06-18 DIAGNOSIS — F329 Major depressive disorder, single episode, unspecified: Secondary | ICD-10-CM | POA: Diagnosis present

## 2016-06-18 DIAGNOSIS — D7589 Other specified diseases of blood and blood-forming organs: Secondary | ICD-10-CM | POA: Diagnosis present

## 2016-06-18 DIAGNOSIS — M545 Low back pain, unspecified: Secondary | ICD-10-CM | POA: Diagnosis present

## 2016-06-18 DIAGNOSIS — F17200 Nicotine dependence, unspecified, uncomplicated: Secondary | ICD-10-CM | POA: Diagnosis present

## 2016-06-18 DIAGNOSIS — M79672 Pain in left foot: Secondary | ICD-10-CM | POA: Diagnosis present

## 2016-06-18 DIAGNOSIS — N183 Chronic kidney disease, stage 3 unspecified: Secondary | ICD-10-CM | POA: Diagnosis present

## 2016-06-18 DIAGNOSIS — M1990 Unspecified osteoarthritis, unspecified site: Secondary | ICD-10-CM | POA: Diagnosis present

## 2016-06-18 DIAGNOSIS — Z9981 Dependence on supplemental oxygen: Secondary | ICD-10-CM

## 2016-06-18 DIAGNOSIS — Z951 Presence of aortocoronary bypass graft: Secondary | ICD-10-CM | POA: Diagnosis not present

## 2016-06-18 DIAGNOSIS — I252 Old myocardial infarction: Secondary | ICD-10-CM

## 2016-06-18 DIAGNOSIS — I739 Peripheral vascular disease, unspecified: Secondary | ICD-10-CM | POA: Diagnosis present

## 2016-06-18 DIAGNOSIS — I4510 Unspecified right bundle-branch block: Secondary | ICD-10-CM | POA: Diagnosis present

## 2016-06-18 DIAGNOSIS — Z7902 Long term (current) use of antithrombotics/antiplatelets: Secondary | ICD-10-CM

## 2016-06-18 DIAGNOSIS — I999 Unspecified disorder of circulatory system: Secondary | ICD-10-CM

## 2016-06-18 DIAGNOSIS — Z79899 Other long term (current) drug therapy: Secondary | ICD-10-CM

## 2016-06-18 DIAGNOSIS — I129 Hypertensive chronic kidney disease with stage 1 through stage 4 chronic kidney disease, or unspecified chronic kidney disease: Secondary | ICD-10-CM | POA: Diagnosis present

## 2016-06-18 DIAGNOSIS — I2510 Atherosclerotic heart disease of native coronary artery without angina pectoris: Secondary | ICD-10-CM | POA: Diagnosis present

## 2016-06-18 DIAGNOSIS — K219 Gastro-esophageal reflux disease without esophagitis: Secondary | ICD-10-CM | POA: Diagnosis present

## 2016-06-18 DIAGNOSIS — Z01818 Encounter for other preprocedural examination: Secondary | ICD-10-CM

## 2016-06-18 DIAGNOSIS — F17210 Nicotine dependence, cigarettes, uncomplicated: Secondary | ICD-10-CM | POA: Diagnosis present

## 2016-06-18 DIAGNOSIS — G8929 Other chronic pain: Secondary | ICD-10-CM | POA: Diagnosis present

## 2016-06-18 DIAGNOSIS — Z720 Tobacco use: Secondary | ICD-10-CM | POA: Diagnosis present

## 2016-06-18 DIAGNOSIS — B1920 Unspecified viral hepatitis C without hepatic coma: Secondary | ICD-10-CM | POA: Diagnosis present

## 2016-06-18 HISTORY — DX: Peripheral vascular disease, unspecified: I73.9

## 2016-06-18 HISTORY — DX: Chronic kidney disease, unspecified: N18.9

## 2016-06-18 LAB — COMPREHENSIVE METABOLIC PANEL
ALBUMIN: 3.3 g/dL — AB (ref 3.5–5.0)
ALK PHOS: 70 U/L (ref 38–126)
ALT: 13 U/L — AB (ref 14–54)
ANION GAP: 6 (ref 5–15)
AST: 16 U/L (ref 15–41)
BUN: 28 mg/dL — AB (ref 6–20)
CALCIUM: 8.6 mg/dL — AB (ref 8.9–10.3)
CO2: 21 mmol/L — AB (ref 22–32)
CREATININE: 1.23 mg/dL — AB (ref 0.44–1.00)
Chloride: 111 mmol/L (ref 101–111)
GFR calc Af Amer: 54 mL/min — ABNORMAL LOW (ref >60)
GFR calc non Af Amer: 46 mL/min — ABNORMAL LOW (ref >60)
GLUCOSE: 90 mg/dL (ref 65–99)
Potassium: 5.1 mmol/L (ref 3.5–5.1)
SODIUM: 138 mmol/L (ref 135–145)
Total Bilirubin: 0.4 mg/dL (ref 0.3–1.2)
Total Protein: 6.7 g/dL (ref 6.5–8.1)

## 2016-06-18 LAB — SODIUM, URINE, RANDOM: SODIUM UR: 120 mmol/L

## 2016-06-18 LAB — LACTIC ACID, PLASMA
Lactic Acid, Venous: 0.5 mmol/L (ref 0.5–1.9)
Lactic Acid, Venous: 0.8 mmol/L (ref 0.5–1.9)

## 2016-06-18 LAB — CREATININE, URINE, RANDOM: CREATININE, URINE: 112.92 mg/dL

## 2016-06-18 LAB — CBC WITH DIFFERENTIAL/PLATELET
Basophils Absolute: 0 10*3/uL (ref 0.0–0.1)
Basophils Relative: 0 %
EOS ABS: 0.2 10*3/uL (ref 0.0–0.7)
Eosinophils Relative: 3 %
HCT: 42.1 % (ref 36.0–46.0)
HEMOGLOBIN: 13.5 g/dL (ref 12.0–15.0)
Lymphocytes Relative: 37 %
Lymphs Abs: 2.8 10*3/uL (ref 0.7–4.0)
MCH: 33.7 pg (ref 26.0–34.0)
MCHC: 32.1 g/dL (ref 30.0–36.0)
MCV: 105 fL — ABNORMAL HIGH (ref 78.0–100.0)
Monocytes Absolute: 0.4 10*3/uL (ref 0.1–1.0)
Monocytes Relative: 6 %
NEUTROS ABS: 4.2 10*3/uL (ref 1.7–7.7)
NEUTROS PCT: 54 %
Platelets: 162 10*3/uL (ref 150–400)
RBC: 4.01 MIL/uL (ref 3.87–5.11)
RDW: 15.2 % (ref 11.5–15.5)
WBC: 7.7 10*3/uL (ref 4.0–10.5)

## 2016-06-18 LAB — CK: Total CK: 43 U/L (ref 38–234)

## 2016-06-18 LAB — HEPARIN LEVEL (UNFRACTIONATED): HEPARIN UNFRACTIONATED: 0.74 [IU]/mL — AB (ref 0.30–0.70)

## 2016-06-18 LAB — MRSA PCR SCREENING: MRSA by PCR: POSITIVE — AB

## 2016-06-18 MED ORDER — SODIUM CHLORIDE 0.9 % IV SOLN
INTRAVENOUS | Status: DC
  Administered 2016-06-18: 20:00:00 via INTRAVENOUS

## 2016-06-18 MED ORDER — FLUOXETINE HCL 20 MG PO CAPS
20.0000 mg | ORAL_CAPSULE | Freq: Every day | ORAL | Status: DC
  Administered 2016-06-19 – 2016-06-24 (×6): 20 mg via ORAL
  Filled 2016-06-18 (×6): qty 1

## 2016-06-18 MED ORDER — ONDANSETRON HCL 4 MG PO TABS: 4.0000 mg | ORAL_TABLET | Freq: Four times a day (QID) | ORAL | Status: DC | PRN

## 2016-06-18 MED ORDER — HEPARIN (PORCINE) IN NACL 100-0.45 UNIT/ML-% IJ SOLN
900.0000 [IU]/h | INTRAMUSCULAR | Status: DC
  Administered 2016-06-18: 1000 [IU]/h via INTRAVENOUS
  Filled 2016-06-18: qty 250

## 2016-06-18 MED ORDER — ABACAVIR-DOLUTEGRAVIR-LAMIVUD 600-50-300 MG PO TABS
1.0000 | ORAL_TABLET | Freq: Every day | ORAL | Status: DC
  Administered 2016-06-19: 1 via ORAL
  Filled 2016-06-18 (×3): qty 1

## 2016-06-18 MED ORDER — SODIUM CHLORIDE 0.9 % IV BOLUS (SEPSIS): 1000.0000 mL | Freq: Once | INTRAVENOUS | Status: DC

## 2016-06-18 MED ORDER — ONDANSETRON HCL 4 MG/2ML IJ SOLN
4.0000 mg | Freq: Four times a day (QID) | INTRAMUSCULAR | Status: DC | PRN
  Administered 2016-06-19: 4 mg via INTRAVENOUS
  Filled 2016-06-18: qty 2

## 2016-06-18 MED ORDER — FAMOTIDINE IN NACL 20-0.9 MG/50ML-% IV SOLN
20.0000 mg | Freq: Every day | INTRAVENOUS | Status: DC
  Administered 2016-06-19 – 2016-06-20 (×2): 20 mg via INTRAVENOUS
  Filled 2016-06-18 (×2): qty 50

## 2016-06-18 MED ORDER — CLOPIDOGREL BISULFATE 75 MG PO TABS: 75.0000 mg | ORAL_TABLET | Freq: Every day | ORAL | Status: DC

## 2016-06-18 MED ORDER — ASPIRIN EC 81 MG PO TBEC
81.0000 mg | DELAYED_RELEASE_TABLET | Freq: Every day | ORAL | Status: DC
  Administered 2016-06-19 – 2016-06-24 (×6): 81 mg via ORAL
  Filled 2016-06-18 (×6): qty 1

## 2016-06-18 MED ORDER — ROSUVASTATIN CALCIUM 10 MG PO TABS
10.0000 mg | ORAL_TABLET | Freq: Every day | ORAL | Status: DC
  Administered 2016-06-19 – 2016-06-24 (×6): 10 mg via ORAL
  Filled 2016-06-18 (×6): qty 1

## 2016-06-18 MED ORDER — FUROSEMIDE 10 MG/ML IJ SOLN: 20.0000 mg | Freq: Once | INTRAMUSCULAR | Status: DC

## 2016-06-18 MED ORDER — POLYETHYLENE GLYCOL 3350 17 G PO PACK: 17.0000 g | PACK | Freq: Every day | ORAL | Status: DC | PRN

## 2016-06-18 MED ORDER — GABAPENTIN 600 MG PO TABS
600.0000 mg | ORAL_TABLET | Freq: Three times a day (TID) | ORAL | Status: DC
  Administered 2016-06-19 – 2016-06-24 (×15): 600 mg via ORAL
  Filled 2016-06-18 (×15): qty 1

## 2016-06-18 MED ORDER — SODIUM CHLORIDE 0.9% FLUSH
3.0000 mL | Freq: Two times a day (BID) | INTRAVENOUS | Status: DC
  Administered 2016-06-18 – 2016-06-22 (×7): 3 mL via INTRAVENOUS

## 2016-06-18 MED ORDER — BISACODYL 5 MG PO TBEC: 5.0000 mg | DELAYED_RELEASE_TABLET | Freq: Every day | ORAL | Status: DC | PRN

## 2016-06-18 MED ORDER — DOXEPIN HCL 10 MG PO CAPS
50.0000 mg | ORAL_CAPSULE | Freq: Every day | ORAL | Status: DC
  Administered 2016-06-19 – 2016-06-23 (×5): 50 mg via ORAL
  Filled 2016-06-18 (×3): qty 1
  Filled 2016-06-18 (×2): qty 5
  Filled 2016-06-18: qty 1
  Filled 2016-06-18: qty 5

## 2016-06-18 MED ORDER — VITAMIN D3 25 MCG (1000 UNIT) PO TABS
5000.0000 [IU] | ORAL_TABLET | Freq: Every day | ORAL | Status: DC
  Administered 2016-06-19 – 2016-06-24 (×6): 5000 [IU] via ORAL
  Filled 2016-06-18 (×11): qty 5

## 2016-06-18 MED ORDER — HYDROMORPHONE HCL 1 MG/ML IJ SOLN
1.0000 mg | INTRAMUSCULAR | Status: DC | PRN
  Administered 2016-06-18 – 2016-06-19 (×2): 1 mg via INTRAVENOUS
  Filled 2016-06-18 (×2): qty 1

## 2016-06-18 NOTE — Telephone Encounter (Signed)
Called by Dr. Isidoro Donning from [facility] regarding Sheri Peterson, she is a 60 year old lady with history of HIV, CAD s/p CABG 12/2015, CKD III, COPD, HTN, peripheral vascular disease recently evaluated by vascular surgery on 06/13/16, when a CT scan showed an occluded right common and external iliac artery and an occluded short segments of the left common iliac artery, she was attempted for vascularization at that time, however occlusion of the bilateral iliac arteries were unable to be recanalized and she is under evaluation for surgical revascularization with bypass. She presented to [facility] ED with ischemic left foot. EDP discussed with vascular surgery who recommended transfer to [facility] ASAP for potential surgical intervention tonight. She is on a heparin drip. She is hemodynamically stable.  RN, on patient arrival, please call [phone] or [phone] and let patient placement RN know of the patient's arrival and a hospitalist will be assigned to admit the patient.   Steph Cheadle M. Cruzita Lederer, MD [facility] [phone]

## 2016-06-18 NOTE — Progress Notes (Signed)
ANTICOAGULATION CONSULT NOTE - Follow Up Consult  Pharmacy Consult for Heparin  Indication: Ischemic foot  Allergies  Allergen Reactions  . Morphine And Related Itching and Rash    Patient Measurements: Height: 5\' 7"  (170.2 cm) Weight: 137 lb 14.4 oz (62.6 kg) IBW/kg (Calculated) : 61.6 Vital Signs: Temp: 98.5 F (36.9 C) (07/23 2100) Temp Source: Oral (07/23 1815) BP: 96/65 (07/23 2145) Pulse Rate: 70 (07/23 2100)  Labs:  Recent Labs  06/18/16 2128 06/18/16 2300  HGB 13.5  --   HCT 42.1  --   PLT 162  --   HEPARINUNFRC  --  0.74*  CREATININE 1.23*  --   CKTOTAL 43  --     Estimated Creatinine Clearance: 46.7 mL/min (by C-G formula based on SCr of 1.23 mg/dL).  Assessment: Heparin for ischemic foot, likely for surgery this week, initial heparin level is supra-therapeutic, no issues per RN.   Goal of Therapy:  Heparin level 0.3-0.7 units/ml Monitor platelets by anticoagulation protocol: Yes   Plan:  -Dec heparin to 900 units/hr -0800 HL  Narda Bonds 06/18/2016,11:53 PM

## 2016-06-18 NOTE — Consult Note (Signed)
Full note to follow Worsening ischemia to left foot and great toe Known aortic and iliac artery occlusion Patient will need axillary bifemoral  Vs aorto bifemoral bypass  --D/C Plavix --IV Heparin --Ok to eat.  NPO after midnight --Anticipate surgery Tuesday ==Check PFT's given home O2   Sheri Peterson

## 2016-06-18 NOTE — Progress Notes (Signed)
ANTICOAGULATION CONSULT NOTE - Initial Consult  Pharmacy Consult for heparin Indication: ischemic foot  Allergies  Allergen Reactions  . Morphine And Related Itching and Rash    Patient Measurements: Height: 5\' 7"  (170.2 cm) IBW/kg (Calculated) : 61.6 Heparin Dosing Weight: 63kg  Vital Signs: Temp: 98.2 F (36.8 C) (07/23 1815) Temp Source: Oral (07/23 1815) BP: 138/78 (07/23 1815)  Labs: No results for input(s): HGB, HCT, PLT, APTT, LABPROT, INR, HEPARINUNFRC, HEPRLOWMOCWT, CREATININE, CKTOTAL, CKMB, TROPONINI in the last 72 hours.  Estimated Creatinine Clearance: 47.1 mL/min (by C-G formula based on SCr of 1.22 mg/dL).   Assessment: 89 YOF sent from [facility] for evaluation by vascular surgery for lower limb ischemia. CT showed occluded R common and external iliac artery and occluded segments of left common iliac artery- revascularization was attempted, however recanalization was unable to be completed.  She was started on heparin at [facility]- 4000 unit bolus and then rate of 1000 units/hr. Looks like it was started ~1600, was off while transferred to the floor since heparin from [facility] doesn't work in our pump (off for <8min)   Goal of Therapy:  Heparin level 0.3-0.7 units/ml Monitor platelets by anticoagulation protocol: Yes   Plan:  -resume heparin at 1000 units/hr -heparin level at 2300- may be low d/t pause in infusion -daily HL and CBC -follow for vascular plans  Latondra Gebhart D. Adeyemi Hamad, PharmD, BCPS Clinical Pharmacist Pager: [phone] 06/18/2016 6:23 PM

## 2016-06-18 NOTE — H&P (Signed)
History and Physical    Sheri Peterson [id number] DOB: 12-09-1955 DOA: 06/18/2016  PCP: Pcp Not In System   Patient coming from: Home, by way of [facility] ED  Chief Complaint: Left foot pain x6 days   HPI: Sheri Peterson is a 60 y.o. female with medical history significant for HIV, CAD status post CABG in February, hypertension, depression, and peripheral arterial disease with occlusions and bilateral iliacs and recent unsuccessful attempt at angioplasty, now under consideration for bypass, presenting in transfer from [facility] for evaluation of left foot pain of 6 days' duration suspected secondary to critical ischemia. Patient reports years long history of claudication and has been under the care of vascular surgery. With worsening symptoms, she underwent lower extremity angiography on 06/13/2016, but unfortunately the bilateral iliacs had occlusions it cannot be recanalized. Patient denies any fevers, chills, chest pains, or palpitations. She endorses stable claudication in the right leg, but notes the excruciating pain in the left foot developing over the past 6 days at rest. She reports that pain in the leg dependently does not provide any relief, but her symptoms are tolerable as long as there is no contact with the left great toe. She reports inability to wear shoes or socks at this time due to the exquisite tenderness involving the left great toe. She reports that over this interval, the left toe has taken on a purplish hue. She denies any trauma to the site, denies wound, and reports only minimal swelling around the left forefoot. She denies any chest pains or palpitations, dyspnea or cough, abdominal pain, nausea, vomiting, diarrhea, dysuria, or suprapubic tenderness.  [facility] ED Course: Upon arrival to the ED in [place], patient is found to be afebrile, saturating well on room air, with blood pressure 109/56, and vitals otherwise stable. She had a CMP which is notable  for serum potassium of 5.7, BUN of 33, and serum creatinine 1.30, slightly up from an apparent baseline of 1.2. CBC features a macrocytosis with MCV of 104, but with remaining indices within the normal limits. Vascular surgery was consulted by the ED physician at [facility] and transferred to [facility] for possible surgical intervention was advised.   Patient is interviewed and examined on the telemetry unit at [facility] where she remains afebrile and with vital signs stable. EKG demonstrates a sinus rhythm with occasional PVC and right bundle branch block with T-wave abnormalities that appear improved relative to the recent prior study. Chest x-ray features a stable left retrocardiac opacity which likely represents a combination of effusion, atelectasis/scarring, and/or consolidation. There were no acute findings on the chest x-ray. Patient will be admitted to the telemetry unit for ongoing evaluation and management of acute/subacute ischemia involving the left foot with known severe PAD already under consideration for bypass surgery.  Review of Systems:  All other systems reviewed and apart from HPI, are negative.  Past Medical History:  Diagnosis Date  . Anxiety   . Arthritis   . Asthma   . COPD (chronic obstructive pulmonary disease) ([place])   . Depression   . Emphysema of lung ([place])   . GERD (gastroesophageal reflux disease)   . Hepatitis    Hep C per H&P by Dr. Trula Slade  . HIV infection [facility])   . Hyperlipidemia   . Hypertension   . Myocardial infarction ([place])   . Seizures ([place])   . Substance abuse     Past Surgical History:  Procedure Laterality Date  . CESAREAN SECTION  X 2  . CORONARY ARTERY BYPASS GRAFT  12/2015   CABG X 3"  . MYRINGOTOMY WITH TUBE PLACEMENT Bilateral "as a child"  . PERIPHERAL VASCULAR CATHETERIZATION Left 06/13/2016   Procedure: Lower Extremity Angiography;  Surgeon: Serafina Mitchell, MD;  Location: [place] [facility];  Service: Cardiovascular;   Laterality: Left;  . STENT PLACEMENT VASCULAR ([place] HX) Left    groin - 2011/2012?  . TONSILLECTOMY     "as a child"  . TUBAL LIGATION    . TUBAL LIGATION       reports that she quit smoking about 4 months ago. Her smoking use included Cigarettes. She has a 23.00 pack-year smoking history. She has never used smokeless tobacco. She reports that she does not drink alcohol or use drugs.  Allergies  Allergen Reactions  . Morphine And Related Itching and Rash    History reviewed. No pertinent family history.   Prior to Admission medications   Medication Sig Start Date End Date Taking? Authorizing Provider  abacavir-dolutegravir-lamiVUDine (TRIUMEQ) 600-50-300 MG tablet Take 1 tablet by mouth daily.    Historical Provider, MD  acetaminophen (TYLENOL) 325 MG tablet Take 650 mg by mouth every 6 (six) hours as needed for mild pain.    Historical Provider, MD  aspirin EC 81 MG tablet Take 81 mg by mouth daily.    Historical Provider, MD  Cholecalciferol (VITAMIN D3) 5000 units TABS Take 5,000 Units by mouth daily.    Historical Provider, MD  clopidogrel (PLAVIX) 75 MG tablet Take 75 mg by mouth daily. Reported on 05/15/2016 05/05/16   Historical Provider, MD  doxepin (SINEQUAN) 50 MG capsule Take 50 mg by mouth at bedtime.    Historical Provider, MD  FLUoxetine (PROZAC) 20 MG tablet Take 20 mg by mouth daily.    Historical Provider, MD  gabapentin (NEURONTIN) 600 MG tablet Take 600 mg by mouth 3 (three) times daily.    Historical Provider, MD  lisinopril (PRINIVIL,ZESTRIL) 40 MG tablet Take 40 mg by mouth 2 (two) times daily. Reported on 05/15/2016    Historical Provider, MD  ranitidine (ZANTAC) 150 MG tablet Take 150 mg by mouth daily.    Historical Provider, MD  rosuvastatin (CRESTOR) 10 MG tablet Take 10 mg by mouth daily.    Historical Provider, MD    Physical Exam: Vitals:   06/18/16 1815 06/18/16 1821  BP: 138/78   Resp: 14   Temp: 98.2 F (36.8 C)   TempSrc: Oral   SpO2: 100%     Weight:  62.6 kg (137 lb 14.4 oz)  Height: 5\' 7"  (1.702 m)       Constitutional: NAD, calm, comfortable Eyes: PERTLA, lids and conjunctivae normal ENMT: Mucous membranes are moist. Posterior pharynx clear of any exudate or lesions.   Neck: normal, supple, no masses, no thyromegaly Respiratory: clear to auscultation bilaterally, no wheezing, no crackles. Normal respiratory effort.   Cardiovascular: S1 & S2 heard, regular rate and rhythm. No extremity edema. No significant JVD. Bilateral pedal pulses not palpable; right PT pulse found with doppler, but none on left; left popliteal pulse not appreciated  Abdomen: No distension, no tenderness, no masses palpated. Bowel sounds normal.  Musculoskeletal: no clubbing. No joint deformity upper and lower extremities. BLE's with atrophy of muscle and skin; left great toe with purple hue, minimal edema and faint erythema overlies left forefoot.  Skin: no significant rashes, lesions, ulcers. Warm, dry.  Neurologic: CN 2-12 grossly intact. Sensation intact, DTR normal. Strength 5/5 in all  4 limbs.  Psychiatric: Normal judgment and insight. Alert and oriented x 3. Normal mood and affect.     Labs on Admission: I have personally reviewed following labs and imaging studies  CBC:  Recent Labs Lab 06/13/16 0730 06/14/16 0420  WBC  --  9.4  HGB 16.0* 13.9  HCT 47.0* 43.3  MCV  --  104.3*  PLT  --  0000000   Basic Metabolic Panel:  Recent Labs Lab 06/13/16 0730 06/14/16 0420  NA 141 136  K 5.8* 6.1*  CL 110 108  CO2  --  22  GLUCOSE 94 113*  BUN 45* 33*  CREATININE 1.50* 1.22*  CALCIUM  --  9.2   GFR: Estimated Creatinine Clearance: 47.1 mL/min (by C-G formula based on SCr of 1.22 mg/dL). Liver Function Tests: No results for input(s): AST, ALT, ALKPHOS, BILITOT, PROT, ALBUMIN in the last 168 hours. No results for input(s): LIPASE, AMYLASE in the last 168 hours. No results for input(s): AMMONIA in the last 168 hours. Coagulation  Profile: No results for input(s): INR, PROTIME in the last 168 hours. Cardiac Enzymes: No results for input(s): CKTOTAL, CKMB, CKMBINDEX, TROPONINI in the last 168 hours. BNP (last 3 results) No results for input(s): PROBNP in the last 8760 hours. HbA1C: No results for input(s): HGBA1C in the last 72 hours. CBG: No results for input(s): GLUCAP in the last 168 hours. Lipid Profile: No results for input(s): CHOL, HDL, LDLCALC, TRIG, CHOLHDL, LDLDIRECT in the last 72 hours. Thyroid Function Tests: No results for input(s): TSH, T4TOTAL, FREET4, T3FREE, THYROIDAB in the last 72 hours. Anemia Panel: No results for input(s): VITAMINB12, FOLATE, FERRITIN, TIBC, IRON, RETICCTPCT in the last 72 hours. Urine analysis: No results found for: COLORURINE, APPEARANCEUR, LABSPEC, PHURINE, GLUCOSEU, HGBUR, BILIRUBINUR, KETONESUR, PROTEINUR, UROBILINOGEN, NITRITE, LEUKOCYTESUR Sepsis Labs: @LABRCNTIP (procalcitonin:4,lacticidven:4) )No results found for this or any previous visit (from the past 240 hour(s)).   Radiological Exams on Admission: No results found.  EKG: Independently reviewed. Sinus rhythm, PVC, RBBB, T-wave abnormality lateral leads; improved from recent prior  Assessment/Plan  1. PAD with left foot ischemia  - Vascular surgery consulting and much appreciated  - Had LE angiography on 06/13/16 with occlusion of b/l iliacs, unable to be recanalized  - Has been on Plavix and ASA 81 mg daily  - Transferred to [facility] from [place] where she presented with 6 days of left foot pain and purplish discoloration to left great toe  - Will optimize for potential surgery; preoperative assessment with EKG, CXR, UA, CBC, chem panel, INR  - Pain-control with prn's  - Heparin infusion per pharmacy  - Supportive care   2. Hyperkalemia  - Serum potassium was 5.7 at outside hospital prior to transfer; had been 6.1 on 06/14/16  - Likely secondary to left foot ischemia and increased tissue catabolism in  setting of CKD with lisinopril use  - Repeat chem panel pending here; will treat with 1 L NS and 20 mg IV Lasix  - Monitor on telemetry; hold lisinopril, continue gentle IVF hydration, repeat serum potassium levels until normal x2    3. CKD stage III  - SCr 1.30 on admission, slightly up from apparent baseline of ~1.20  - With PAD and CAD, likely a component of RAS  - Holding lisinopril in setting of hyperkalemia and mild bump in baseline SCr  - Providing gentle IVF hydration; avoid nephrotoxins where possible   - Repeat chem panel in am   4. Hypertension   - Running on the  lower side currently  - Manged with lisinopril at home, currently held as above  - Will monitor and treat with prn hydralazine for now   5. HIV  - Do not see a CD4 or VL in EMR  - Pt reports has been stable  - Continue current management with Triumeq   6. GERD - Managed with Zantac at home - Continue H2-blocker with Pepcid while in hospital   7. Macrocytosis  - MCV 104 with normal H&H; MCV had been wnl in 2015  - Check B12 and folate    DVT prophylaxis: On heparin infusion  Code Status: Full  Family Communication: Discussed with patient Disposition Plan: Admit to telemetry  Consults called: Vascular surgery  Admission status: Inpatient    Vianne Bulls, MD [facility] Pager [phone]  If 7PM-7AM, please contact night-coverage [email or web address] Password [facility]  06/18/2016, 6:55 PM

## 2016-06-18 NOTE — Progress Notes (Addendum)
Spoke to [facility] on call with [facility] for clarification about fluids to be given and lasix which is due as well. She stated, " hold the fluid bolus, 75 mL/hr, and lasix until the labs result and notify me when they do." I will continue to monitor the client closely and notify [facility] when that results.  Saddie Benders RN  Addendum:  Notified [place] that the labs have resulted. I will continue to monitor the client closely.  Saddie Benders RN

## 2016-06-19 ENCOUNTER — Inpatient Hospital Stay (HOSPITAL_COMMUNITY): Payer: Medicare (Managed Care) | Admitting: Certified Registered"

## 2016-06-19 ENCOUNTER — Encounter (HOSPITAL_COMMUNITY): Payer: Self-pay | Admitting: General Practice

## 2016-06-19 ENCOUNTER — Encounter (HOSPITAL_COMMUNITY)
Admission: EM | Disposition: A | Discharge status: Home Health | Payer: Self-pay | Source: Other Acute Inpatient Hospital | Attending: Internal Medicine

## 2016-06-19 DIAGNOSIS — I998 Other disorder of circulatory system: Secondary | ICD-10-CM

## 2016-06-19 HISTORY — PX: AXILLARY-FEMORAL BYPASS GRAFT: SHX894

## 2016-06-19 LAB — URINALYSIS, ROUTINE W REFLEX MICROSCOPIC
Bilirubin Urine: NEGATIVE
Glucose, UA: NEGATIVE mg/dL
Hgb urine dipstick: NEGATIVE
Ketones, ur: NEGATIVE mg/dL
LEUKOCYTES UA: NEGATIVE
NITRITE: NEGATIVE
PH: 5 (ref 5.0–8.0)
Protein, ur: NEGATIVE mg/dL
SPECIFIC GRAVITY, URINE: 1.019 (ref 1.005–1.030)

## 2016-06-19 LAB — PROTIME-INR
INR: 1.45 (ref 0.00–1.49)
Prothrombin Time: 17.7 seconds — ABNORMAL HIGH (ref 11.6–15.2)

## 2016-06-19 LAB — VITAMIN B12: Vitamin B-12: 329 pg/mL (ref 180–914)

## 2016-06-19 SURGERY — CREATION, BYPASS, ARTERIAL, AXILLARY TO BILATERAL FEMORAL, USING GRAFT
Anesthesia: General | Laterality: Bilateral

## 2016-06-19 MED ORDER — ACETAMINOPHEN 325 MG RE SUPP: 325.0000 mg | RECTAL | Status: DC | PRN

## 2016-06-19 MED ORDER — HYDROMORPHONE HCL 1 MG/ML IJ SOLN
0.2500 mg | INTRAMUSCULAR | Status: DC | PRN
  Administered 2016-06-19 (×3): 0.5 mg via INTRAVENOUS

## 2016-06-19 MED ORDER — HYDROMORPHONE HCL 1 MG/ML IJ SOLN
INTRAMUSCULAR | Status: AC
  Administered 2016-06-19: 0.5 mg via INTRAVENOUS
  Filled 2016-06-19: qty 1

## 2016-06-19 MED ORDER — MUSCLE RUB 10-15 % EX CREA
1.0000 "application " | TOPICAL_CREAM | Freq: Two times a day (BID) | CUTANEOUS | Status: DC | PRN
  Filled 2016-06-19: qty 85

## 2016-06-19 MED ORDER — IOPAMIDOL (ISOVUE-300) INJECTION 61%
INTRAVENOUS | Status: AC
Start: 2016-06-19 — End: 2016-06-19
  Filled 2016-06-19: qty 50

## 2016-06-19 MED ORDER — HYDROMORPHONE HCL 1 MG/ML IJ SOLN
0.5000 mg | INTRAMUSCULAR | Status: DC | PRN
  Administered 2016-06-19 – 2016-06-20 (×3): 1 mg via INTRAVENOUS
  Filled 2016-06-19 (×3): qty 1

## 2016-06-19 MED ORDER — POTASSIUM CHLORIDE CRYS ER 20 MEQ PO TBCR: 20.0000 meq | EXTENDED_RELEASE_TABLET | Freq: Every day | ORAL | Status: DC | PRN

## 2016-06-19 MED ORDER — PHENYLEPHRINE HCL 10 MG/ML IJ SOLN
INTRAMUSCULAR | Status: DC | PRN
  Administered 2016-06-19: 120 ug via INTRAVENOUS

## 2016-06-19 MED ORDER — MAGNESIUM SULFATE 2 GM/50ML IV SOLN
2.0000 g | Freq: Every day | INTRAVENOUS | Status: DC | PRN
  Filled 2016-06-19: qty 50

## 2016-06-19 MED ORDER — ALBUTEROL SULFATE (2.5 MG/3ML) 0.083% IN NEBU
3.0000 mL | INHALATION_SOLUTION | Freq: Four times a day (QID) | RESPIRATORY_TRACT | Status: DC | PRN
  Administered 2016-06-23 – 2016-06-24 (×2): 3 mL via RESPIRATORY_TRACT
  Filled 2016-06-19 (×2): qty 3

## 2016-06-19 MED ORDER — PROPOFOL 10 MG/ML IV BOLUS
INTRAVENOUS | Status: DC | PRN
  Administered 2016-06-19: 150 mg via INTRAVENOUS

## 2016-06-19 MED ORDER — LACTATED RINGERS IV SOLN
INTRAVENOUS | Status: DC | PRN
  Administered 2016-06-19 (×3): via INTRAVENOUS

## 2016-06-19 MED ORDER — SUGAMMADEX SODIUM 200 MG/2ML IV SOLN
INTRAVENOUS | Status: AC
  Filled 2016-06-19: qty 2

## 2016-06-19 MED ORDER — PANTOPRAZOLE SODIUM 40 MG PO TBEC
40.0000 mg | DELAYED_RELEASE_TABLET | Freq: Every day | ORAL | Status: DC
  Administered 2016-06-20 – 2016-06-21 (×2): 40 mg via ORAL
  Filled 2016-06-19 (×3): qty 1

## 2016-06-19 MED ORDER — DEXTROSE 5 % IV SOLN
1.5000 g | Freq: Two times a day (BID) | INTRAVENOUS | Status: DC
  Administered 2016-06-19: 1.5 g via INTRAVENOUS
  Filled 2016-06-19 (×2): qty 1.5

## 2016-06-19 MED ORDER — 0.9 % SODIUM CHLORIDE (POUR BTL) OPTIME
TOPICAL | Status: DC | PRN
  Administered 2016-06-19: 2000 mL

## 2016-06-19 MED ORDER — EPHEDRINE SULFATE 50 MG/ML IJ SOLN
INTRAMUSCULAR | Status: DC | PRN
  Administered 2016-06-19 (×8): 10 mg via INTRAVENOUS

## 2016-06-19 MED ORDER — SODIUM CHLORIDE 0.9 % IV SOLN
INTRAVENOUS | Status: DC | PRN
  Administered 2016-06-19: 500 mL

## 2016-06-19 MED ORDER — PROTAMINE SULFATE 10 MG/ML IV SOLN
INTRAVENOUS | Status: DC | PRN
  Administered 2016-06-19: 15 mg via INTRAVENOUS
  Administered 2016-06-19 (×2): 10 mg via INTRAVENOUS

## 2016-06-19 MED ORDER — MIDAZOLAM HCL 2 MG/2ML IJ SOLN: 0.5000 mg | Freq: Once | INTRAMUSCULAR | Status: DC | PRN

## 2016-06-19 MED ORDER — PROPOFOL 10 MG/ML IV BOLUS
INTRAVENOUS | Status: AC
  Filled 2016-06-19: qty 20

## 2016-06-19 MED ORDER — ENOXAPARIN SODIUM 30 MG/0.3ML ~~LOC~~ SOLN
30.0000 mg | SUBCUTANEOUS | Status: DC
  Administered 2016-06-20 – 2016-06-22 (×3): 30 mg via SUBCUTANEOUS
  Filled 2016-06-19 (×3): qty 0.3

## 2016-06-19 MED ORDER — GLYCOPYRROLATE 0.2 MG/ML IJ SOLN
INTRAMUSCULAR | Status: DC | PRN
  Administered 2016-06-19: 0.2 mg via INTRAVENOUS

## 2016-06-19 MED ORDER — FENTANYL CITRATE (PF) 250 MCG/5ML IJ SOLN
INTRAMUSCULAR | Status: AC
  Filled 2016-06-19: qty 5

## 2016-06-19 MED ORDER — SUGAMMADEX SODIUM 200 MG/2ML IV SOLN
INTRAVENOUS | Status: DC | PRN
  Administered 2016-06-19: 125.2 mg via INTRAVENOUS

## 2016-06-19 MED ORDER — OXYCODONE HCL 5 MG PO TABS
5.0000 mg | ORAL_TABLET | Freq: Four times a day (QID) | ORAL | Status: DC | PRN
Start: 2016-06-19 — End: 2016-06-24
  Administered 2016-06-19: 10 mg via ORAL
  Administered 2016-06-20 (×2): 5 mg via ORAL
  Administered 2016-06-20: 10 mg via ORAL
  Administered 2016-06-21 (×3): 5 mg via ORAL
  Administered 2016-06-22 (×2): 10 mg via ORAL
  Administered 2016-06-22: 5 mg via ORAL
  Administered 2016-06-23 (×4): 10 mg via ORAL
  Filled 2016-06-19: qty 2
  Filled 2016-06-19 (×2): qty 1
  Filled 2016-06-19 (×5): qty 2
  Filled 2016-06-19 (×3): qty 1
  Filled 2016-06-19 (×2): qty 2
  Filled 2016-06-19: qty 1

## 2016-06-19 MED ORDER — ARTIFICIAL TEARS OP OINT
TOPICAL_OINTMENT | OPHTHALMIC | Status: DC | PRN
  Administered 2016-06-19: 1 via OPHTHALMIC

## 2016-06-19 MED ORDER — ALBUMIN HUMAN 5 % IV SOLN
INTRAVENOUS | Status: DC | PRN
  Administered 2016-06-19 (×2): via INTRAVENOUS

## 2016-06-19 MED ORDER — LIDOCAINE 5 % EX PTCH
1.0000 | MEDICATED_PATCH | CUTANEOUS | Status: DC
  Administered 2016-06-19 – 2016-06-23 (×5): 1 via TRANSDERMAL
  Filled 2016-06-19 (×7): qty 1

## 2016-06-19 MED ORDER — MIDAZOLAM HCL 2 MG/2ML IJ SOLN
INTRAMUSCULAR | Status: AC
  Filled 2016-06-19: qty 2

## 2016-06-19 MED ORDER — SODIUM CHLORIDE 0.9 % IV SOLN
500.0000 mL | Freq: Once | INTRAVENOUS | Status: AC | PRN
  Administered 2016-06-19: 500 mL via INTRAVENOUS

## 2016-06-19 MED ORDER — MUPIROCIN 2 % EX OINT
1.0000 "application " | TOPICAL_OINTMENT | Freq: Two times a day (BID) | CUTANEOUS | Status: AC
  Administered 2016-06-19 – 2016-06-24 (×9): 1 via NASAL
  Filled 2016-06-19 (×4): qty 22

## 2016-06-19 MED ORDER — CEFUROXIME SODIUM 1.5 G IJ SOLR
1.5000 g | INTRAMUSCULAR | Status: AC
  Administered 2016-06-19: 1.5 g via INTRAVENOUS
  Filled 2016-06-19: qty 1.5

## 2016-06-19 MED ORDER — PHENYLEPHRINE HCL 10 MG/ML IJ SOLN
INTRAMUSCULAR | Status: AC
  Filled 2016-06-19: qty 1

## 2016-06-19 MED ORDER — GUAIFENESIN-DM 100-10 MG/5ML PO SYRP: 15.0000 mL | ORAL_SOLUTION | ORAL | Status: DC | PRN

## 2016-06-19 MED ORDER — ACETAMINOPHEN 325 MG PO TABS
325.0000 mg | ORAL_TABLET | ORAL | Status: DC | PRN
  Administered 2016-06-20: 650 mg via ORAL
  Filled 2016-06-19: qty 2

## 2016-06-19 MED ORDER — CHLORHEXIDINE GLUCONATE CLOTH 2 % EX PADS
6.0000 | MEDICATED_PAD | Freq: Every day | CUTANEOUS | Status: DC
  Administered 2016-06-20 – 2016-06-24 (×4): 6 via TOPICAL

## 2016-06-19 MED ORDER — PHENOL 1.4 % MT LIQD
1.0000 | OROMUCOSAL | Status: DC | PRN
Start: 2016-06-19 — End: 2016-06-24

## 2016-06-19 MED ORDER — SODIUM CHLORIDE 0.9 % IV SOLN
INTRAVENOUS | Status: DC
  Administered 2016-06-19 – 2016-06-23 (×6): via INTRAVENOUS

## 2016-06-19 MED ORDER — DEXTROSE 5 % IV SOLN
INTRAVENOUS | Status: DC | PRN
  Administered 2016-06-19: 30 ug/min via INTRAVENOUS

## 2016-06-19 MED ORDER — METOPROLOL TARTRATE 5 MG/5ML IV SOLN
2.0000 mg | INTRAVENOUS | Status: DC | PRN
Start: 2016-06-19 — End: 2016-06-24

## 2016-06-19 MED ORDER — EPHEDRINE 5 MG/ML INJ
INTRAVENOUS | Status: AC
  Filled 2016-06-19: qty 20

## 2016-06-19 MED ORDER — HEPARIN SODIUM (PORCINE) 1000 UNIT/ML IJ SOLN
INTRAMUSCULAR | Status: DC | PRN
  Administered 2016-06-19: 6000 [IU] via INTRAVENOUS

## 2016-06-19 MED ORDER — ONDANSETRON HCL 4 MG/2ML IJ SOLN: 4.0000 mg | Freq: Four times a day (QID) | INTRAMUSCULAR | Status: DC | PRN

## 2016-06-19 MED ORDER — LABETALOL HCL 5 MG/ML IV SOLN: 10.0000 mg | INTRAVENOUS | Status: DC | PRN

## 2016-06-19 MED ORDER — ROCURONIUM BROMIDE 100 MG/10ML IV SOLN
INTRAVENOUS | Status: DC | PRN
  Administered 2016-06-19: 50 mg via INTRAVENOUS

## 2016-06-19 MED ORDER — ALUM & MAG HYDROXIDE-SIMETH 200-200-20 MG/5ML PO SUSP: 15.0000 mL | ORAL | Status: DC | PRN

## 2016-06-19 MED ORDER — PROTAMINE SULFATE 10 MG/ML IV SOLN
INTRAVENOUS | Status: AC
  Filled 2016-06-19: qty 5

## 2016-06-19 MED ORDER — DOCUSATE SODIUM 100 MG PO CAPS
100.0000 mg | ORAL_CAPSULE | Freq: Every day | ORAL | Status: DC
  Administered 2016-06-20 – 2016-06-23 (×4): 100 mg via ORAL
  Filled 2016-06-19 (×4): qty 1

## 2016-06-19 MED ORDER — MEPERIDINE HCL 25 MG/ML IJ SOLN: 6.2500 mg | INTRAMUSCULAR | Status: DC | PRN

## 2016-06-19 MED ORDER — PROMETHAZINE HCL 25 MG/ML IJ SOLN: 6.2500 mg | INTRAMUSCULAR | Status: DC | PRN

## 2016-06-19 MED ORDER — FENTANYL CITRATE (PF) 100 MCG/2ML IJ SOLN
INTRAMUSCULAR | Status: DC | PRN
  Administered 2016-06-19: 50 ug via INTRAVENOUS
  Administered 2016-06-19: 100 ug via INTRAVENOUS

## 2016-06-19 MED ORDER — HYDRALAZINE HCL 20 MG/ML IJ SOLN: 5.0000 mg | INTRAMUSCULAR | Status: DC | PRN

## 2016-06-19 SURGICAL SUPPLY — 62 items
BAG ISOLATION DRAPE 18X18 (DRAPES) ×1 IMPLANT
BENZOIN TINCTURE PRP APPL 2/3 (GAUZE/BANDAGES/DRESSINGS) ×9 IMPLANT
CANISTER SUCTION 2500CC (MISCELLANEOUS) ×3 IMPLANT
CANNULA VESSEL 3MM 2 BLNT TIP (CANNULA) ×6 IMPLANT
CLIP LIGATING EXTRA MED SLVR (CLIP) ×6 IMPLANT
CLIP LIGATING EXTRA SM BLUE (MISCELLANEOUS) ×6 IMPLANT
CLOSURE STERI-STRIP 1/2X4 (GAUZE/BANDAGES/DRESSINGS) ×2
CLOSURE WOUND 1/2 X4 (GAUZE/BANDAGES/DRESSINGS) ×1
CLSR STERI-STRIP ANTIMIC 1/2X4 (GAUZE/BANDAGES/DRESSINGS) ×4 IMPLANT
DRAIN SNY 10X20 3/4 PERF (WOUND CARE) IMPLANT
DRAPE INCISE IOBAN 66X45 STRL (DRAPES) ×6 IMPLANT
DRAPE ISOLATION BAG 18X18 (DRAPES) ×2
DRAPE X-RAY CASS 24X20 (DRAPES) IMPLANT
DRSG COVADERM 4X10 (GAUZE/BANDAGES/DRESSINGS) IMPLANT
DRSG COVADERM 4X8 (GAUZE/BANDAGES/DRESSINGS) IMPLANT
ELECT REM PT RETURN 9FT ADLT (ELECTROSURGICAL) ×3
ELECTRODE REM PT RTRN 9FT ADLT (ELECTROSURGICAL) ×1 IMPLANT
EVACUATOR SILICONE 100CC (DRAIN) IMPLANT
GAUZE SPONGE 4X4 12PLY STRL (GAUZE/BANDAGES/DRESSINGS) ×3 IMPLANT
GLOVE BIO SURGEON STRL SZ 6.5 (GLOVE) ×4 IMPLANT
GLOVE BIO SURGEONS STRL SZ 6.5 (GLOVE) ×2
GLOVE BIOGEL M 6.5 STRL (GLOVE) ×6 IMPLANT
GLOVE BIOGEL PI IND STRL 7.0 (GLOVE) ×1 IMPLANT
GLOVE BIOGEL PI INDICATOR 7.0 (GLOVE) ×2
GLOVE INDICATOR 6.5 STRL GRN (GLOVE) ×6 IMPLANT
GLOVE SS BIOGEL STRL SZ 7.5 (GLOVE) ×1 IMPLANT
GLOVE SUPERSENSE BIOGEL SZ 7.5 (GLOVE) ×2
GLOVE SURG SS PI 6.5 STRL IVOR (GLOVE) ×3 IMPLANT
GOWN STRL REUS W/ TWL LRG LVL3 (GOWN DISPOSABLE) ×3 IMPLANT
GOWN STRL REUS W/TWL LRG LVL3 (GOWN DISPOSABLE) ×6
GRAFT CV 60X8STRG TUBE KNTD (Vascular Products) ×1 IMPLANT
GRAFT HEMASHIELD 8MM (Vascular Products) ×4 IMPLANT
GRAFT VASC STRG 30X8KNIT (Vascular Products) ×1 IMPLANT
INSERT FOGARTY SM (MISCELLANEOUS) ×3 IMPLANT
KIT BASIN OR (CUSTOM PROCEDURE TRAY) ×3 IMPLANT
KIT ROOM TURNOVER OR (KITS) ×3 IMPLANT
LOOP VESSEL MAXI BLUE (MISCELLANEOUS) ×6 IMPLANT
LOOP VESSEL MINI RED (MISCELLANEOUS) ×6 IMPLANT
NS IRRIG 1000ML POUR BTL (IV SOLUTION) ×6 IMPLANT
PACK CV ACCESS (CUSTOM PROCEDURE TRAY) ×3 IMPLANT
PACK UNIVERSAL I (CUSTOM PROCEDURE TRAY) ×3 IMPLANT
PAD ARMBOARD 7.5X6 YLW CONV (MISCELLANEOUS) ×6 IMPLANT
SET COLLECT BLD 21X3/4 12 (NEEDLE) IMPLANT
SPONGE GAUZE 4X4 12PLY STER LF (GAUZE/BANDAGES/DRESSINGS) ×9 IMPLANT
STAPLER VISISTAT 35W (STAPLE) IMPLANT
STOPCOCK 4 WAY LG BORE MALE ST (IV SETS) IMPLANT
STRIP CLOSURE SKIN 1/2X4 (GAUZE/BANDAGES/DRESSINGS) ×2 IMPLANT
SUT PROLENE 5 0 CC 1 (SUTURE) ×3 IMPLANT
SUT PROLENE 6 0 CC (SUTURE) ×12 IMPLANT
SUT SILK 2 0 FS (SUTURE) IMPLANT
SUT SILK 2 0 SH (SUTURE) ×3 IMPLANT
SUT SILK 4 0 (SUTURE) ×2
SUT SILK 4-0 18XBRD TIE 12 (SUTURE) ×1 IMPLANT
SUT VIC AB 2-0 CTX 36 (SUTURE) ×9 IMPLANT
SUT VIC AB 3-0 SH 27 (SUTURE) ×6
SUT VIC AB 3-0 SH 27X BRD (SUTURE) ×3 IMPLANT
SUT VICRYL 4-0 PS2 18IN ABS (SUTURE) ×3 IMPLANT
SYRINGE 20CC LL (MISCELLANEOUS) ×3 IMPLANT
TAPE CLOTH SURG 4X10 WHT LF (GAUZE/BANDAGES/DRESSINGS) ×6 IMPLANT
TRAY FOLEY W/METER SILVER 16FR (SET/KITS/TRAYS/PACK) ×3 IMPLANT
TUBING EXTENTION W/L.L. (IV SETS) IMPLANT
WATER STERILE IRR 1000ML POUR (IV SOLUTION) ×3 IMPLANT

## 2016-06-19 NOTE — Interval H&P Note (Signed)
History and Physical Interval Note:  06/19/2016 8:39 AM  Sheri Peterson  has presented today for surgery, with the diagnosis of Peripheral Vascular Disease  The various methods of treatment have been discussed with the patient and family. After consideration of risks, benefits and other options for treatment, the patient has consented to  Procedure(s): BYPASS GRAFT AXILLA-BIFEMORAL (N/A) as a surgical intervention .  The patient's history has been reviewed, patient examined, no change in status, stable for surgery.  I have reviewed the patient's chart and labs.  Questions were answered to the patient's satisfaction.     Curt Jews

## 2016-06-19 NOTE — Transfer of Care (Signed)
Immediate Anesthesia Transfer of Care Note  Patient: Sheri Peterson  Procedure(s) Performed: Procedure(s): LEFT  AXILLO-BIFEMORAL BYPASS GRAFT (Bilateral)  Patient Location: PACU  Anesthesia Type:General  Level of Consciousness: awake and alert   Airway & Oxygen Therapy: Patient Spontanous Breathing and Patient connected to nasal cannula oxygen  Post-op Assessment: Report given to RN and Post -op Vital signs reviewed and stable  Post vital signs: Reviewed and stable  Last Vitals:  Vitals:   06/19/16 0540 06/19/16 0755  BP: (!) 154/78   Pulse: 72   Resp: 17   Temp: 37 C 36.5 C    Last Pain:  Vitals:   06/19/16 0755  TempSrc: Oral  PainSc: 5       Patients Stated Pain Goal: 0 ([id number])  Complications: No apparent anesthesia complications

## 2016-06-19 NOTE — Anesthesia Preprocedure Evaluation (Addendum)
Anesthesia Evaluation  Patient identified by MRN, date of birth, ID band Patient awake    Reviewed: Allergy & Precautions, NPO status , Patient's Chart, lab work & pertinent test results  History of Anesthesia Complications Negative for: history of anesthetic complications  Airway Mallampati: I  TM Distance: >3 FB Neck ROM: Full    Dental  (+) Edentulous Upper, Edentulous Lower, Dental Advidsory Given   Pulmonary shortness of breath, COPD,  COPD inhaler and oxygen dependent, Current Smoker, former smoker,    breath sounds clear to auscultation       Cardiovascular hypertension, Pt. on medications (-) angina+ Past MI, + CABG and + Peripheral Vascular Disease   Rhythm:Regular Rate:Normal  3/17 post CABG ECHO: Basal to mid inferior posterior akinesis, EF 50%, unchanged from pre op. Mild concentric LVH   Neuro/Psych Seizures -,  PSYCHIATRIC DISORDERS Anxiety Depression    GI/Hepatic GERD  Medicated and Controlled,(+) Hepatitis -, C  Endo/Other  negative endocrine ROS  Renal/GU Renal InsufficiencyRenal disease (creat 1.23)     Musculoskeletal  (+) Arthritis , Osteoarthritis,    Abdominal   Peds  Hematology  (+) HIV,   Anesthesia Other Findings   Reproductive/Obstetrics                           Anesthesia Physical Anesthesia Plan  ASA: III  Anesthesia Plan: General   Post-op Pain Management:    Induction: Intravenous  Airway Management Planned: Oral ETT  Additional Equipment:   Intra-op Plan:   Post-operative Plan: Extubation in OR  Informed Consent: I have reviewed the patients History and Physical, chart, labs and discussed the procedure including the risks, benefits and alternatives for the proposed anesthesia with the patient or authorized representative who has indicated his/her understanding and acceptance.   Dental Advisory Given  Plan Discussed with: CRNA and  Surgeon  Anesthesia Plan Comments: (Plan routine monitors, GETA)       Anesthesia Quick Evaluation

## 2016-06-19 NOTE — Anesthesia Postprocedure Evaluation (Signed)
Anesthesia Post Note  Patient: Sheri Peterson  Procedure(s) Performed: Procedure(s) (LRB): LEFT  AXILLO-BIFEMORAL BYPASS GRAFT (Bilateral)  Patient location during evaluation: PACU Anesthesia Type: General Level of consciousness: awake and alert, oriented and patient cooperative Pain management: pain level controlled Vital Signs Assessment: post-procedure vital signs reviewed and stable Respiratory status: spontaneous breathing, nonlabored ventilation, respiratory function stable and patient connected to nasal cannula oxygen Cardiovascular status: stable Postop Assessment: no signs of nausea or vomiting Anesthetic complications: no    Last Vitals:  Vitals:   06/19/16 1730 06/19/16 1735  BP:  97/64  Pulse: 73 77  Resp: 11 12  Temp:      Last Pain:  Vitals:   06/19/16 1615  TempSrc:   PainSc: Asleep                 Janisse Ghan,E. Athony Coppa

## 2016-06-19 NOTE — H&P (View-Only) (Signed)
    Subjective  -  Patient continues to have significant pain in the left foot and left toe.  She does not want anybody to touch it.   Physical Exam:  Continues to be able to move her toes and has sensation Extremely tender to the touch   Assessment/Plan:  Aortic occlusion  Patient continues to have significant pain in her left foot.  I have recommended proceeding with a axillary bifemoral bypass graft.This will be done by my partner Dr. Donnetta Hutching.  The risks and benefits of the procedure were discussed with the patient including the risk of infection, bleeding, and patency rates.  Because of the patient's multiple comorbidities including coronary artery disease, home oxygen requirement, and addition to currently being on Plavix, I feel a axillary bifemoral bypass graft is the appropriate intervention at this time.  [name] 06/19/2016 8:19 AM --  Vitals:   06/19/16 0540 06/19/16 0755  BP: (!) 154/78   Pulse: 72   Resp: 17   Temp: 98.6 F (37 C) 97.7 F (36.5 C)    Intake/Output Summary (Last 24 hours) at 06/19/16 0819 Last data filed at 06/19/16 0541  Gross per 24 hour  Intake           319.75 ml  Output              300 ml  Net            19.75 ml     Laboratory CBC    Component Value Date/Time   WBC 7.7 06/18/2016 2128   HGB 13.5 06/18/2016 2128   HCT 42.1 06/18/2016 2128   PLT 162 06/18/2016 2128    BMET    Component Value Date/Time   NA 138 06/18/2016 2128   NA 139 12/04/2013 1105   K 5.1 06/18/2016 2128   CL 111 06/18/2016 2128   CO2 21 (L) 06/18/2016 2128   GLUCOSE 90 06/18/2016 2128   BUN 28 (H) 06/18/2016 2128   BUN 16 12/04/2013 1105   CREATININE 1.23 (H) 06/18/2016 2128   CALCIUM 8.6 (L) 06/18/2016 2128   GFRNONAA 46 (L) 06/18/2016 2128   GFRAA 54 (L) 06/18/2016 2128    COAG Lab Results  Component Value Date   INR 1.45 06/18/2016   No results found for: PTT  Antibiotics Anti-infectives    Start     Dose/Rate Route Frequency  Ordered Stop   06/19/16 1000  abacavir-dolutegravir-lamiVUDine (TRIUMEQ) 600-50-300 MG per tablet 1 tablet     1 tablet Oral Daily 06/18/16 1855         V. Leia Alf, M.D. [facility] [place] Office: [phone] Pager:  [phone]

## 2016-06-19 NOTE — OR Nursing (Addendum)
Pt denies pain, HR 75, RR 9-12, sats 100% on 2LNC.  Pt is easily arousable and oriented x4.  BP 67/52.  NS bolus 500cc initiated per post op orders.  All dressings CDI and no obvious signs of bleeding.  Most likely related to Dilaudid previously given for pain.

## 2016-06-19 NOTE — Progress Notes (Signed)
    Subjective  -  Patient continues to have significant pain in the left foot and left toe.  She does not want anybody to touch it.   Physical Exam:  Continues to be able to move her toes and has sensation Extremely tender to the touch   Assessment/Plan:  Aortic occlusion  Patient continues to have significant pain in her left foot.  I have recommended proceeding with a axillary bifemoral bypass graft.This will be done by my partner Dr. Donnetta Hutching.  The risks and benefits of the procedure were discussed with the patient including the risk of infection, bleeding, and patency rates.  Because of the patient's multiple comorbidities including coronary artery disease, home oxygen requirement, and addition to currently being on Plavix, I feel a axillary bifemoral bypass graft is the appropriate intervention at this time.  Sheri Peterson 06/19/2016 8:19 AM --  Vitals:   06/19/16 0540 06/19/16 0755  BP: (!) 154/78   Pulse: 72   Resp: 17   Temp: 98.6 F (37 C) 97.7 F (36.5 C)    Intake/Output Summary (Last 24 hours) at 06/19/16 0819 Last data filed at 06/19/16 0541  Gross per 24 hour  Intake           319.75 ml  Output              300 ml  Net            19.75 ml     Laboratory CBC    Component Value Date/Time   WBC 7.7 06/18/2016 2128   HGB 13.5 06/18/2016 2128   HCT 42.1 06/18/2016 2128   PLT 162 06/18/2016 2128    BMET    Component Value Date/Time   NA 138 06/18/2016 2128   NA 139 12/04/2013 1105   K 5.1 06/18/2016 2128   CL 111 06/18/2016 2128   CO2 21 (L) 06/18/2016 2128   GLUCOSE 90 06/18/2016 2128   BUN 28 (H) 06/18/2016 2128   BUN 16 12/04/2013 1105   CREATININE 1.23 (H) 06/18/2016 2128   CALCIUM 8.6 (L) 06/18/2016 2128   GFRNONAA 46 (L) 06/18/2016 2128   GFRAA 54 (L) 06/18/2016 2128    COAG Lab Results  Component Value Date   INR 1.45 06/18/2016   No results found for: PTT  Antibiotics Anti-infectives    Start     Dose/Rate Route Frequency  Ordered Stop   06/19/16 1000  abacavir-dolutegravir-lamiVUDine (TRIUMEQ) 600-50-300 MG per tablet 1 tablet     1 tablet Oral Daily 06/18/16 1855         V. Leia Alf, M.D. [facility] [place] Office: [phone] Pager:  [phone]

## 2016-06-19 NOTE — Progress Notes (Signed)
[facility]                                                                              Patient Demographics  Sheri Peterson, is a 60 y.o. female, DOB - 04-26-1956, [id number]  Admit date - 06/18/2016   Admitting Physician Vianne Bulls, MD  Outpatient Primary MD for the patient is Pcp Not In System  Outpatient specialists:   LOS - 1  days    No chief complaint on file.      Brief summary   Patient is 60 year old female with HIV, CAD status post CABG in February, hypertension, depression, and peripheral arterial disease with occlusions and bilateral iliacs and recent unsuccessful attempt at angioplasty, now under consideration for bypass, presenting in transfer from [facility] for evaluation of left foot pain of 6 days' duration suspected secondary to critical ischemia. Patient reported years long history of claudication and has been under the care of vascular surgery. With worsening symptoms, she underwent lower extremity angiography on 06/13/2016, but unfortunately the bilateral iliacs had occlusions it cannot be recanalized.She reported stable claudication in the right leg, but notes the excruciating pain in the left foot developing over the past 6 days at rest Upon arrival to the ED in [place], patient is found to be afebrile, saturating well on room air, with blood pressure 109/56, and vitals otherwise stable. She had a CMP which is notable for serum potassium of 5.7, BUN of 33, and serum creatinine 1.30, slightly up from an apparent baseline of 1.2. CBC features a macrocytosis with MCV of 104, but with remaining indices within the normal limits. Vascular surgery was consulted by the ED physician at [facility] and transferred to [facility] for possible surgical intervention was advised.   Assessment & Plan    Principal Problem: Severe PAD with left foot ischemia  - Had LE angiography on 06/13/16 with occlusion of b/l iliacs, unable to be  recanalized  - Has been on Plavix and ASA 81 mg daily , continue heparin drip - Vascular surgery consulted, planning bypass   Hyperkalemia : Potassium 5.7 at [facility] and is 6.1 on 7/19, has chronic kidney disease. Stage III - Potassium improving, hold ACE inhibitor, gentle hydration  Mild acute on CKD stage III  - SCr 1.30 on admission, slightly up from apparent baseline of ~1.20 , 1.5 on 7/18 - Holding lisinopril in setting of hyperkalemia and bump in baseline SCr  - Creatinine improving to 1.2   Hypertension   - Currently stable, hold lisinopril, continue hydralazine as needed   HIV  -  Continue current management with [facility]   GERD - Managed with Zantac at home - Continue H2-blocker with Pepcid while in hospital    Macrocytosis  - MCV 104 with normal H&H; MCV had been wnl in 2015  -B12 normal folate pending  Code Status: full DVT Prophylaxis:  * heparin  Family Communication: Discussed in detail with the patient, all imaging results, lab results explained to the patient   Disposition Plan: Pending the vascular surgery  Time Spent in minutes  25 minutes  Procedures:  None  Consultants:  Vascular surgery  Antimicrobials:      Medications  Scheduled Meds: . abacavir-dolutegravir-lamiVUDine  1 tablet Oral Daily  . aspirin EC  81 mg Oral Daily  . [START ON 06/20/2016] cefUROXime (ZINACEF)  IV  1.5 g Intravenous To SSTC  . cholecalciferol  5,000 Units Oral Daily  . doxepin  50 mg Oral QHS  . famotidine (PEPCID) IV  20 mg Intravenous Daily  . FLUoxetine  20 mg Oral Daily  . gabapentin  600 mg Oral TID  . rosuvastatin  10 mg Oral Daily  . sodium chloride flush  3 mL Intravenous Q12H   Continuous Infusions: . heparin 900 Units/hr (06/19/16 0000)   PRN Meds:.bisacodyl, HYDROmorphone (DILAUDID) injection, ondansetron **OR** ondansetron (ZOFRAN) IV, polyethylene glycol   Antibiotics   Anti-infectives    Start     Dose/Rate Route Frequency  Ordered Stop   06/20/16 0700  cefUROXime (ZINACEF) 1.5 g in dextrose 5 % 50 mL IVPB     1.5 g 100 mL/hr over 30 Minutes Intravenous To Short Stay 06/19/16 0820 06/21/16 0700   06/19/16 1000  abacavir-dolutegravir-lamiVUDine (TRIUMEQ) [id number] MG per tablet 1 tablet     1 tablet Oral Daily 06/18/16 1855          Subjective:   Sheri Peterson was seen and examined today.  Pain currently controlled, denies any nausea, vomiting, fevers. Patient denies dizziness, chest pain, shortness of breath, abdominal pain, new weakness, numbess, tingling. No acute events overnight.  Left great toe with purple hue  Objective:   Vitals:   06/19/16 0008 06/19/16 0015 06/19/16 0540 06/19/16 0755  BP:  121/69 (!) 154/78   Pulse:   72   Resp:  16 17   Temp: 98.3 F (36.8 C)  98.6 F (37 C) 97.7 F (36.5 C)  TempSrc:    Oral  SpO2:   99% 96%  Weight:   62.6 kg (138 lb 1.6 oz)   Height:        Intake/Output Summary (Last 24 hours) at 06/19/16 1050 Last data filed at 06/19/16 0852  Gross per 24 hour  Intake           363.55 ml  Output              300 ml  Net            63.55 ml     Wt Readings from Last 3 Encounters:  06/19/16 62.6 kg (138 lb 1.6 oz)  06/13/16 63 kg (139 lb)  05/15/16 62.1 kg (137 lb)     Exam  General: Alert and oriented x 3, NAD  HEENT:  PERRLA, EOMI  Neck: Supple, no JVD,  Cardiovascular: S1 S2 auscultated, no rubs, murmurs or gallops. Regular rate and rhythm.  Respiratory: Clear to auscultation bilaterally, no wheezing, rales or rhonchi  Gastrointestinal: Soft, nontender, nondistended, + bowel sounds  Ext: no cyanosis clubbing or edema, Left great toe with purple heel with bilateral pedal pulses not palpable  Neuro: AAOx3, Cr N's II- XII. Strength 5/5 upper and lower extremities bilaterally  Skin: No rashes  Psych: Normal affect and demeanor, alert and oriented x3    Data Reviewed:  I have personally reviewed following labs and imaging  studies  Micro Results Recent Results (from the past 240 hour(s))  MRSA PCR Screening     Status: Abnormal   Collection Time: 06/18/16  7:11 PM  Result Value Ref Range Status   MRSA by PCR POSITIVE (A) NEGATIVE Final  Comment:        The GeneXpert MRSA Assay (FDA approved for NASAL specimens only), is one component of a comprehensive MRSA colonization surveillance program. It is not intended to diagnose MRSA infection nor to guide or monitor treatment for MRSA infections. RESULT CALLED TO, READ BACK BY AND VERIFIED WITH: [place],T RN 2132 06/18/16 Chauncey     Radiology Reports Dg Chest Port 1 View  Result Date: 06/18/2016 CLINICAL DATA:  60 year old female -preoperative respiratory examination prior to extremity procedure/surgery. EXAM: PORTABLE CHEST 1 VIEW COMPARISON:  01/25/2016 and prior radiographs FINDINGS: Cardiomediastinal silhouette is unchanged with evidence of previous CABG. Unchanged left retrocardiac opacity probably represents a combination of effusion and atelectasis/scarring versus consolidation. There is no evidence of pneumothorax. There is no evidence of right effusion. IMPRESSION: Left retrocardiac opacity again noted -likely representing combination of effusion, atelectasis/ scarring and/or consolidation. No new or acute abnormalities identified otherwise. Electronically Signed   By: Margarette Canada M.D.   On: 06/18/2016 19:02  Ct Angio Chest/abd/pel For Dissection W And/or W/wo  Result Date: 06/14/2016 CLINICAL DATA:  Peripheral vascular disease. Aortobifemoral versus axillo-bifemoral bypass graft. EXAM: CT ANGIOGRAPHY CHEST, ABDOMEN AND PELVIS TECHNIQUE: Multidetector CT imaging through the chest, abdomen and pelvis was performed using the standard protocol during bolus administration of intravenous contrast. Multiplanar reconstructed images and MIPs were obtained and reviewed to evaluate the vascular anatomy. CONTRAST:  100 cc Isovue 370 COMPARISON:  01/28/2016  FINDINGS: CTA CHEST FINDINGS There is no evidence of aortic dissection or intramural hematoma. There is smooth bus circumferential soft plaque involving the distal aortic arch and descending thoracic aorta. Atherosclerotic calcifications in the arch are noted. Maximal diameter of the ascending aorta is 3.3 cm. The arch and descending aorta are non aneurysmal. The innominate artery is 1.7 cm in caliber. There is some chronic mural thrombus with calcification along the wall. It is patent. Right subclavian and common carotid arteries are patent. Right vertebral artery is patent. There is mild narrowing of the right subclavian artery at the thoracic outlet. Left common carotid artery via bovine origin is widely patent. There is some irregular plaque at the origin of the left subclavian artery without significant focal narrowing. The left vertebral artery does not opacified. It is likely occluded. Left axillary artery is patent. No filling defect is present in the pulmonary arterial tree to suggest acute pulmonary thromboembolism. Postoperative changes from sternotomy are noted. Internal mammary bypass graft is suspected. There is no abnormal mediastinal adenopathy. Small mediastinal nodes are stable. No pericardial effusion. Normal thyroid gland. No pneumothorax or pleural effusion. There is chronic atelectasis in the posterior basal segment of the left lower lobe. Minimal dependent atelectasis at the right base. The left hemidiaphragm is elevated. No acute bony deformity. Advanced degenerative disc disease at C5-6 and C6-7 is present. No vertebral compression deformity in the thoracic spine. T12 hemangioma Review of the MIP images confirms the above findings. CTA ABDOMEN AND PELVIS FINDINGS Aorta is non aneurysmal and patent. There is no evidence of dissection or intramural hematoma. Smooth circumferential plaque along the aorta is noted. The distal aorta is occluded just above the bifurcation. Compared to the prior  study, there is some stranding within the fat surrounding the infrarenal aorta. The wall is also somewhat hazy. These findings suggest an inflammatory process. Severe narrowing in the proximal celiac axis likely due to a combination of atherosclerosis in median arcuate ligament syndrome. Branch vessels are patent. SMA is patent. IMA is patent. There is critical narrowing of  the right renal artery just beyond its takeoff. There is mild plaque and narrowing at the origin of the left renal artery. The bilateral common iliac and external iliac arteries are occluded. Tiny internal iliac artery branches reconstitute. Bilateral common femoral arteries reconstitute and are diminutive. No significant narrowing in the visualize right common femoral, superficial femoral, profunda femoral arteries. There is mild diffuse narrowing of the left common femoral artery. The proximal left superficial femoral artery is severely narrowed. Left profunda femoral artery branches are patent. Liver is unremarkable There is sludge in the gallbladder. Spleen, pancreas are within normal limits Diffuse prominence of the adrenal glands without focal nodule. Kidneys are within normal limits. No focal mass in the colon. Normal appendix. No evidence of small-bowel obstruction. No free-fluid.  There is no abnormal retroperitoneal adenopathy. Bladder is distended. Uterus is diminutive. Adnexa are unremarkable. No vertebral compression deformity. There is degenerative disc disease with an lumbar spine. It is advanced at L4-5 with vacuum. Multilevel facet arthropathy is also present. Review of the MIP images confirms the above findings. IMPRESSION: Chest: There is no evidence of thoracic aortic dissection or intramural hematoma. There is atherosclerotic plaque involving the large and descending aorta. There is plaque in the innominate artery without significant narrowing. It is mildly ectatic at 1.7 cm. Mild narrowing of the right subclavian artery at  the thoracic inlet. Right axillary artery is patent Left common carotid artery is patent. Left subclavian and axillary arteries are patent. The left vertebral artery does not opacify and may be chronically occluded. Chronic elevation of left hemidiaphragm with chronic atelectasis in the posterior basal left lower lobe. Abdomen: There is no evidence of aortic aneurysm or dissection. The aorta is occluded just above the bifurcation. There are inflammatory changes involving the wall of the distal thoracic aorta and adjacent fat. Noninfectious and infectious inflammatory disorders are possibilities. Correlate clinically as for the need for blood cultures. 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 Bilateral common femoral arteries reconstitute. Significant narrowing at the origin of the left superficial femoral artery. 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 Electronically Signed   By: Marybelle Killings M.D.   On: 06/14/2016 11:41    Lab Data:  CBC:  Recent Labs Lab 06/13/16 0730 06/14/16 0420 06/18/16 2128  WBC  --  9.4 7.7  NEUTROABS  --   --  4.2  HGB 16.0* 13.9 13.5  HCT 47.0* 43.3 42.1  MCV  --  104.3* 105.0*  PLT  --  179 0000000   Basic Metabolic Panel:  Recent Labs Lab 06/13/16 0730 06/14/16 0420 06/18/16 2128  NA 141 136 138  K 5.8* 6.1* 5.1  CL 110 108 111  CO2  --  22 21*  GLUCOSE 94 113* 90  BUN 45* 33* 28*  CREATININE 1.50* 1.22* 1.23*  CALCIUM  --  9.2 8.6*   GFR: Estimated Creatinine Clearance: 46.7 mL/min (by C-G formula based on SCr of 1.23 mg/dL). Liver Function Tests:  Recent Labs Lab 06/18/16 2128  AST 16  ALT 13*  ALKPHOS 70  BILITOT 0.4  PROT 6.7  ALBUMIN 3.3*   No results for input(s): LIPASE, AMYLASE in the last 168 hours. No results for input(s): AMMONIA in the last 168 hours. Coagulation Profile:  Recent Labs Lab 06/18/16 2128  INR 1.45   Cardiac Enzymes:  Recent Labs Lab 06/18/16 2128  CKTOTAL 43   BNP (last 3  results) No results for input(s): PROBNP in the last 8760 hours. HbA1C: No results for  input(s): HGBA1C in the last 72 hours. CBG: No results for input(s): GLUCAP in the last 168 hours. Lipid Profile: No results for input(s): CHOL, HDL, LDLCALC, TRIG, CHOLHDL, LDLDIRECT in the last 72 hours. Thyroid Function Tests: No results for input(s): TSH, T4TOTAL, FREET4, T3FREE, THYROIDAB in the last 72 hours. Anemia Panel:  Recent Labs  06/18/16 2128  VITAMINB12 329   Urine analysis:    Component Value Date/Time   COLORURINE YELLOW 06/18/2016 2103   APPEARANCEUR CLEAR 06/18/2016 2103   LABSPEC 1.019 06/18/2016 2103   PHURINE 5.0 06/18/2016 2103   GLUCOSEU NEGATIVE 06/18/2016 2103   HGBUR NEGATIVE 06/18/2016 2103   BILIRUBINUR NEGATIVE 06/18/2016 2103   KETONESUR NEGATIVE 06/18/2016 2103   PROTEINUR NEGATIVE 06/18/2016 2103   NITRITE NEGATIVE 06/18/2016 2103   LEUKOCYTESUR NEGATIVE 06/18/2016 2103     RAI,RIPUDEEP M.D. [facility] 06/19/2016, 10:50 AM  Pager: [phone] Between 7am to 7pm - call Pager - [phone]  After 7pm go to [email or web address] - password [email or web address]  Call night coverage person covering after 7pm

## 2016-06-19 NOTE — Op Note (Signed)
OPERATIVE REPORT  DATE OF SURGERY: 06/19/2016  PATIENT: Sheri Peterson, 60 y.o. female MRN: [id number]  DOB: 12/05/1955  PRE-OPERATIVE DIAGNOSIS: Critical ischemia bilateral lower extremities, left greater than right  POST-OPERATIVE DIAGNOSIS:  Same  PROCEDURE: Left axillary to femoral and femoral to femoral bypass with 8 mm Hemashield graft  SURGEON:  Curt Jews, M.D.  PHYSICIAN ASSISTANT: Samantha Rhyne PA-C  ANESTHESIA:  Gen.  EBL: 50 ml  Total I/O In: 1543.8 [I.V.:1043.8; IV Piggyback:500] Out: 600 [Urine:550; Blood:50]  BLOOD ADMINISTERED: None  DRAINS: None  SPECIMEN: None  COUNTS CORRECT:  YES  PLAN OF CARE: PACU stable   PATIENT DISPOSITION:  PACU - hemodynamically stable  PROCEDURE DETAILS: Patient was taken to the operating room placed supine position area of the left chest left neck left leg and both groins were prepped and draped in usual sterile fashion. Incision was made below the level of the clavicle and carried down through the muscle bundles of the pectoralis major muscle. The muscle bodies were divided to give exposure to the perfect minus which was also mobilized. The left axillary subclavian pulse was palpable. The vein was overlying this and vein branches were ligated with 4-0 silk ties and divided for mobilization of the axillary artery. Minimal calcium changes. An excellent pulse and was of good caliber. Next separate incision was made over the left groin. There was no pulse present. The common superficial femoral and profundus femoris arteries were exposed December encircled with blue vessel loops. Tributary branches were controlled with 2-0 silk Potts ties. Next separate incision was made over the right groin again not isolating the common superficial femoral and profundus femoris arteries. A long straight Gore-Tex was used to create a tunnel from the left femoral artery to the left axillary artery which is been exposed. A tunneler was then used to  create a tunnel from the right to left groin. 21mm Hemashield graft was brought from the axillary to femoral tunnel on the left and a 30 cm 33mm graft was brought through the femorofemoral tunnel. The patient was given 6000 units intravenous heparin and after adequate circulation time the left axillary artery was occluded proximally and distally with baby Gregory clamps. The artery was opened with 11 blade some ulcerative Potts scissors. The graft was sewn end-to-side to the artery with a running 6-0 Prolene suture. This anastomosis was tested and found to be adequate. The graft was placed through the tunnel and the appropriate tension. Next the left common superficial femoral and profundus femoris arteries were occluded. The common femoral artery was opened with 11 blade some ulcerative knee with Potts scissors. The arteriotomy ended above the superficial femoral artery and profunda bifurcation. 3 dilator passed without resistance through this superficial femoral and profundus femoris artery. The graft was cut to appropriate length and spatulated and sewn end-to-side to the artery with a running 5-0 Prolene suture. Prior to completion of the anastomosis the usual flushing maneuvers were undertaken. This anastomosis completed and found to be adequate. Next asked him graft was reoccluded. A area above the femoral anastomosis was chosen to anastomosis the femorofemoral bypass. An ellipse of graft was removed and the left side of the femorofemoral bypass was sewn end-to-side to the axillofemoral graft with a running 5-0 Prolene suture. This was tested and found to be adequate. Finally the right common superficial femoral and profundus femoris arteries were occluded. The common femoral artery was opened with an 11 blade some ulcerative with Potts scissors down towards the superficial  femoral artery. Again a 3 dilator passed easily through the proximal superficial femoral and profundus femoris arteries. The femorofemoral  graft was cut to appropriate length and was spatulated and sewn end-to-side to the common femoral artery with a running 6-0 Prolene suture. Prior to completion of this anastomosis the usual flushing maneuvers were undertaken. Anastomosis completed and cancer removed. There was good Doppler flow in the feet which was graft dependent. The shunt was given 50 mg of protamine to reverse heparin. Wounds irrigated with saline. Hemostasis tablet cautery. Wounds were closed with 2-0 Vicryl in the fascia on all 3 incisions. Skin was closed with 301 4-0 subcuticular Vicryl sutures. Sterile dressing was applied the patient was transferred to the recovery room in stable condition   Curt Jews, M.D. 06/19/2016 3:22 PM

## 2016-06-19 NOTE — Progress Notes (Signed)
  Day of Surgery Note    Subjective:  Sleepy; no complaints  Vitals:   06/19/16 0540 06/19/16 0755  BP: (!) 154/78   Pulse: 72   Resp: 17   Temp: 98.6 F (37 C) 97.7 F (36.5 C)    Incisions:   Bilateral groin incisions and left infraclavicular bandages are clean and dry without hematoma Extremities:  + doppler signals bilateral DP/PT Cardiac:  regular Lungs:  Non labored   Assessment/Plan:  This is a 60 y.o. female who is s/p left axillary to bifemoral bypass grafting  -bypass graft patent with + doppler signals bilateral DP/PT -does not need heparin to be continued -to [place] when bed available   Leontine Locket, PA-C 06/19/2016 3:26 PM

## 2016-06-19 NOTE — Progress Notes (Signed)
Pt refusing lab work. Dr. Tana Coast made aware

## 2016-06-20 ENCOUNTER — Encounter (HOSPITAL_COMMUNITY): Payer: Medicare (Managed Care)

## 2016-06-20 ENCOUNTER — Encounter (HOSPITAL_COMMUNITY): Payer: Self-pay | Admitting: Vascular Surgery

## 2016-06-20 LAB — FOLATE RBC
FOLATE, RBC: 941 ng/mL (ref >498)
Folate, Hemolysate: 370.7 ng/mL
Hematocrit: 39.4 % (ref 34.0–46.6)

## 2016-06-20 LAB — BASIC METABOLIC PANEL
Anion gap: 5 (ref 5–15)
BUN: 13 mg/dL (ref 6–20)
CALCIUM: 8.2 mg/dL — AB (ref 8.9–10.3)
CO2: 22 mmol/L (ref 22–32)
CREATININE: 1.05 mg/dL — AB (ref 0.44–1.00)
Chloride: 109 mmol/L (ref 101–111)
GFR calc non Af Amer: 56 mL/min — ABNORMAL LOW (ref >60)
Glucose, Bld: 120 mg/dL — ABNORMAL HIGH (ref 65–99)
Potassium: 4.5 mmol/L (ref 3.5–5.1)
SODIUM: 136 mmol/L (ref 135–145)

## 2016-06-20 LAB — GLUCOSE, CAPILLARY: Glucose-Capillary: 119 mg/dL — ABNORMAL HIGH (ref 65–99)

## 2016-06-20 LAB — CBC
HEMATOCRIT: 33.6 % — AB (ref 36.0–46.0)
Hemoglobin: 10.5 g/dL — ABNORMAL LOW (ref 12.0–15.0)
MCH: 33.2 pg (ref 26.0–34.0)
MCHC: 31.3 g/dL (ref 30.0–36.0)
MCV: 106.3 fL — ABNORMAL HIGH (ref 78.0–100.0)
Platelets: 121 10*3/uL — ABNORMAL LOW (ref 150–400)
RBC: 3.16 MIL/uL — ABNORMAL LOW (ref 3.87–5.11)
RDW: 15.1 % (ref 11.5–15.5)
WBC: 9.1 10*3/uL (ref 4.0–10.5)

## 2016-06-20 MED ORDER — FAMOTIDINE 20 MG PO TABS
20.0000 mg | ORAL_TABLET | Freq: Every day | ORAL | Status: DC
  Administered 2016-06-21 – 2016-06-24 (×4): 20 mg via ORAL
  Filled 2016-06-20 (×4): qty 1

## 2016-06-20 MED ORDER — PIPERACILLIN-TAZOBACTAM 3.375 G IVPB
3.3750 g | Freq: Three times a day (TID) | INTRAVENOUS | Status: DC
  Administered 2016-06-20 – 2016-06-23 (×9): 3.375 g via INTRAVENOUS
  Filled 2016-06-20 (×12): qty 50

## 2016-06-20 MED ORDER — ABACAVIR-DOLUTEGRAVIR-LAMIVUD 600-50-300 MG PO TABS
1.0000 | ORAL_TABLET | Freq: Every day | ORAL | Status: DC
  Administered 2016-06-20 – 2016-06-23 (×4): 1 via ORAL
  Filled 2016-06-20 (×5): qty 1

## 2016-06-20 MED ORDER — SODIUM CHLORIDE 0.9 % IV BOLUS (SEPSIS)
1000.0000 mL | Freq: Once | INTRAVENOUS | Status: AC
  Administered 2016-06-20: 1000 mL via INTRAVENOUS

## 2016-06-20 MED ORDER — SODIUM CHLORIDE 0.9 % IV BOLUS (SEPSIS)
500.0000 mL | Freq: Once | INTRAVENOUS | Status: AC
  Administered 2016-06-20: 500 mL via INTRAVENOUS

## 2016-06-20 MED ORDER — VANCOMYCIN HCL 500 MG IV SOLR
500.0000 mg | Freq: Two times a day (BID) | INTRAVENOUS | Status: DC
  Administered 2016-06-20 – 2016-06-23 (×6): 500 mg via INTRAVENOUS
  Filled 2016-06-20 (×10): qty 500

## 2016-06-20 MED ORDER — CLOPIDOGREL BISULFATE 75 MG PO TABS
75.0000 mg | ORAL_TABLET | Freq: Every day | ORAL | Status: DC
  Administered 2016-06-20 – 2016-06-24 (×5): 75 mg via ORAL
  Filled 2016-06-20 (×5): qty 1

## 2016-06-20 NOTE — Progress Notes (Addendum)
[facility]  Subjective  - Pain is an issue, increases with movement.  She stats her foot feels better.   Objective (!) 109/59 96 99.6 F (37.6 C) (Oral) 18 97%  Intake/Output Summary (Last 24 hours) at 06/20/16 0729 Last data filed at 06/20/16 0717  Gross per 24 hour  Intake           3371.3 ml  Output             1450 ml  Net           1921.3 ml    Doppler DP and PT bil. Incisional dressings clean and dry Heart RRR Lungs non labored breathing  Assessment/Planning: POD #1 left axillary to bifemoral bypass grafting  Patent by pass graft OK to restart Plavix from a vascular point of view CR stable trending down  Lovenox for DVT prophylaxis   [facility] 06/20/2016 7:29 AM --  Laboratory Lab Results:  Recent Labs  06/18/16 2128 06/20/16 0502  WBC 7.7 9.1  HGB 13.5 10.5*  HCT 42.1 33.6*  PLT 162 121*   BMET  Recent Labs  06/18/16 2128 06/20/16 0502  NA 138 136  K 5.1 4.5  CL 111 109  CO2 21* 22  GLUCOSE 90 120*  BUN 28* 13  CREATININE 1.23* 1.05*  CALCIUM 8.6* 8.2*    COAG Lab Results  Component Value Date   INR 1.45 06/18/2016   No results found for: PTT   I have examined the patient, reviewed and agree with above.  Curt Jews, MD 06/20/2016 12:35 PM

## 2016-06-20 NOTE — Progress Notes (Signed)
OT Cancellation Note  Patient Details Name: Sheri Peterson MRN: [id number] DOB: 08/26/1956   Cancelled Treatment:    Reason Eval/Treat Not Completed: Fatigue/lethargy limiting ability to participate. Will see in am.  Peters, OT/L  [phone] 06/20/2016 06/20/2016, 3:32 PM

## 2016-06-20 NOTE — Progress Notes (Signed)
Pharmacy Antibiotic Note  Sheri Peterson is a 60 y.o. female admitted on 06/18/2016 S/P left axillary to bifemoral bypass graft 7/24. Pt spiked fever of 101.6 today - consulted for vancomycin/zosyn for presumed infection. WBC WNL. SCr now down to 1.05.  Plan: -Start Vancomycin 500mg  q12h -Start Zosyn 3.375g q8h -F/U cultures -Monitor renal fcn, clinical progress, LOT.   Height: 5\' 7"  (170.2 cm) Weight: 149 lb 14.6 oz (68 kg) IBW/kg (Calculated) : 61.6  Temp (24hrs), Avg:98.6 F (37 C), Min:96.7 F (35.9 C), Max:101.6 F (38.7 C)   Recent Labs Lab 06/14/16 0420 06/18/16 1853 06/18/16 2128 06/18/16 2300 06/20/16 0502  WBC 9.4  --  7.7  --  9.1  CREATININE 1.22*  --  1.23*  --  1.05*  LATICACIDVEN  --  0.5  --  0.8  --     Estimated Creatinine Clearance: 54.7 mL/min (by C-G formula based on SCr of 1.05 mg/dL).    Allergies  Allergen Reactions  . Morphine And Related Itching and Rash    Antimicrobials this admission: Vanc 7/25 >> Zosyn 7/25 >>  Microbiology results: 7/25 BCx: sent 7/23 MRSA PCR: Pos  Thank you for allowing pharmacy to be a part of this patient's care.  Stephens November, PharmD Clinical Pharmacist  10:59 AM, 06/20/2016

## 2016-06-20 NOTE — Care Management Important Message (Signed)
Important Message  Patient Details  Name: ANYELINA LAINHART MRN: [id number] Date of Birth: Feb 23, 1956   Medicare Important Message Given:  Yes    Zenon Mayo, RN 06/20/2016, 12:47 PM

## 2016-06-20 NOTE — Care Management Note (Signed)
Case Management Note  Patient Details  Name: OBIE SCRAGG MRN: [id number] Date of Birth: 07/16/1956  Subjective/Objective:     Patient lives alone, per pt eval rec snf, she is refusing snf, she states she has  [facility] she goes there Mon  Wed and Friday from 10 am to 5pm in [place] and she gets all her health care through them, they even pick her up for appointments, this is like ([facility]).  She states she is asking her friend Eustaquio Maize to stay with her for a couple of days when she is discharged.  Patient states her brother helps her with her groceries.   [facility] phone number is  [phone]  .  NCM spoke with [facility] she states patient can come there Mon- Friday from 10-5pm and get her therapy with them with no problem, they will cont to assess her to see when she will no longer need therapy also.  This facility is just like PACE.             Action/Plan:   Expected Discharge Date:                  Expected Discharge Plan:  Skilled Nursing Facility  In-House Referral:  Clinical Social Work  Discharge planning Services  CM Consult  Post Acute Care Choice:    Choice offered to:     DME Arranged:    DME Agency:     HH Arranged:    [place] Agency:     Status of Service:  In process, will continue to follow  If discussed at Long Length of Stay Meetings, dates discussed:    Additional Comments:  Zenon Mayo, RN 06/20/2016, 3:50 PM

## 2016-06-20 NOTE — Care Management Important Message (Signed)
Important Message  Patient Details  Name: Sheri Peterson MRN: [id number] Date of Birth: February 05, 1956   Medicare Important Message Given:  Yes    Zenon Mayo, RN 06/20/2016, 12:46 PM

## 2016-06-20 NOTE — Progress Notes (Signed)
[facility]                                                                              Patient Demographics  Sheri Peterson, is a 60 y.o. female, DOB - 1956-09-28, [id number]  Admit date - 06/18/2016   Admitting Physician Vianne Bulls, MD  Outpatient Primary MD for the patient is Pcp Not In System  Outpatient specialists:   LOS - 2  days    No chief complaint on file.      Brief summary   Patient is 60 year old female with HIV, CAD status post CABG in February, hypertension, depression, and peripheral arterial disease with occlusions and bilateral iliacs and recent unsuccessful attempt at angioplasty, now under consideration for bypass, presenting in transfer from [facility] for evaluation of left foot pain of 6 days' duration suspected secondary to critical ischemia. Patient reported years long history of claudication and has been under the care of vascular surgery. With worsening symptoms, she underwent lower extremity angiography on 06/13/2016, but unfortunately the bilateral iliacs had occlusions it cannot be recanalized.She reported stable claudication in the right leg, but notes the excruciating pain in the left foot developing over the past 6 days at rest Upon arrival to the ED in [place], patient is found to be afebrile, saturating well on room air, with blood pressure 109/56, and vitals otherwise stable. She had a CMP which is notable for serum potassium of 5.7, BUN of 33, and serum creatinine 1.30, slightly up from an apparent baseline of 1.2. CBC features a macrocytosis with MCV of 104, but with remaining indices within the normal limits. Vascular surgery was consulted by the ED physician at [facility] and transferred to [facility] for possible surgical intervention was advised.   Assessment & Plan    Principal Problem: Severe PAD with left foot ischemia  - Had LE angiography on 06/13/16 with occlusion of b/l iliacs, unable to be  recanalized  - Has been on Plavix and ASA 81 mg daily , continue heparin drip - Vascular surgery consulted, patient underwent left axillary to femoral and femoral to femoral bypass on 7/24  Fevers/sirs: - Patient spiking fevers this morning, temp 101.11F, BP low 93/59, heart rate 91. Obtain 2 sets of blood cultures, no URI symptoms, UA negative for UTI on 7/24. -  Empirically placed on vancomycin and Zosyn until source clear. - IV fluid bolus and then continue hydration   Hyperkalemia : Potassium 5.7 at [facility] and is 6.1 on 7/19, has chronic kidney disease. Stage III - Potassium improved, continue to hold ACE inhibitor  Mild acute on CKD stage III resolved - SCr 1.30 on admission, slightly up from apparent baseline of ~1.20 , 1.5 on 7/18 - Holding lisinopril in setting of hyperkalemia and bump in baseline SCr    Hypertension   - Currently BP low, likely due to #2, continue to hold lisinopril, placed on IV fluids    HIV  -  Continue current management with [facility]   GERD - Managed with Zantac at home - Continue H2-blocker with Pepcid while in hospital    Macrocytosis  - MCV 104 with  normal H&H; MCV had been wnl in 2015  -B12 normal, folate pending  Code Status: full DVT Prophylaxis: Lovenox Family Communication: Discussed in detail with the patient, all imaging results, lab results explained to the patient   Disposition Plan: cont in SDU today  Time Spent in minutes  25 minutes  Procedures:  Left axillary to femoral and femoral to femoral bypass with 8 mm Hemashield graft  Consultants:   Vascular surgery  Antimicrobials:   IV vanc 7/25  IV Zosyn 7/25   Medications  Scheduled Meds: . abacavir-dolutegravir-lamiVUDine  1 tablet Oral Q2000  . aspirin EC  81 mg Oral Daily  . cefUROXime (ZINACEF)  IV  1.5 g Intravenous Q12H  . Chlorhexidine Gluconate Cloth  6 each Topical Q0600  . cholecalciferol  5,000 Units Oral Daily  . docusate sodium  100 mg  Oral Daily  . doxepin  50 mg Oral QHS  . enoxaparin (LOVENOX) injection  30 mg Subcutaneous Q24H  . [START ON 06/21/2016] famotidine  20 mg Oral Daily  . FLUoxetine  20 mg Oral Daily  . gabapentin  600 mg Oral TID  . lidocaine  1 patch Transdermal Q24H  . mupirocin ointment  1 application Nasal BID  . pantoprazole  40 mg Oral Daily  . rosuvastatin  10 mg Oral Daily  . sodium chloride  500 mL Intravenous Once  . sodium chloride flush  3 mL Intravenous Q12H   Continuous Infusions: . sodium chloride 75 mL/hr at 06/19/16 1902   PRN Meds:.acetaminophen **OR** acetaminophen, albuterol, alum & mag hydroxide-simeth, bisacodyl, guaiFENesin-dextromethorphan, hydrALAZINE, HYDROmorphone (DILAUDID) injection, labetalol, magnesium sulfate 1 - 4 g bolus IVPB, metoprolol, MUSCLE RUB, ondansetron **OR** ondansetron (ZOFRAN) IV, ondansetron, oxyCODONE, phenol, polyethylene glycol, potassium chloride   Antibiotics   Anti-infectives    Start     Dose/Rate Route Frequency Ordered Stop   06/20/16 2000  abacavir-dolutegravir-lamiVUDine (TRIUMEQ) 600-50-300 MG per tablet 1 tablet     1 tablet Oral Daily 06/20/16 0821     06/20/16 0700  cefUROXime (ZINACEF) 1.5 g in dextrose 5 % 50 mL IVPB     1.5 g 100 mL/hr over 30 Minutes Intravenous To Short Stay 06/19/16 0820 06/19/16 1237   06/19/16 2300  cefUROXime (ZINACEF) 1.5 g in dextrose 5 % 50 mL IVPB     1.5 g 100 mL/hr over 30 Minutes Intravenous Every 12 hours 06/19/16 1850 06/20/16 2259   06/19/16 1000  abacavir-dolutegravir-lamiVUDine (TRIUMEQ) [id number] MG per tablet 1 tablet  Status:  Discontinued     1 tablet Oral Daily 06/18/16 1855 06/20/16 0821        Subjective:   Sheri Peterson was seen and examined today.  Spiking fevers this morning, feels weak. No nausea or vomiting, trying to eat breakfast.   Patient denies dizziness, chest pain, shortness of breath, abdominal pain.   Objective:   Vitals:   06/20/16 0211 06/20/16 0347 06/20/16 0500  06/20/16 0741  BP: (!) 93/59 (!) 109/59  (!) 93/59  Pulse: 91 96  90  Resp: 18 18  (!) 21  Temp:  99.6 F (37.6 C)  (!) 101.6 F (38.7 C)  TempSrc:  Oral  Axillary  SpO2: 91% 97%  95%  Weight:   68 kg (149 lb 14.6 oz)   Height:        Intake/Output Summary (Last 24 hours) at 06/20/16 1030 Last data filed at 06/20/16 0750  Gross per 24 hour  Intake  3990 ml  Output             1450 ml  Net             2540 ml     Wt Readings from Last 3 Encounters:  06/20/16 68 kg (149 lb 14.6 oz)  06/13/16 63 kg (139 lb)  05/15/16 62.1 kg (137 lb)     Exam  General: Alert and oriented x 3, NAD  HEENT:    Neck: Supple, no JVD,  Cardiovascular: S1 S2 clear, RRR  Respiratory: Clear to auscultation bilaterally, no wheezing, rales or rhonchi  Gastrointestinal: Soft, nontender, nondistended, + bowel sounds  Ext: no cyanosis clubbing or edema,   Neuro: no new deficits   Skin: No rashes  Psych: Normal affect and demeanor, alert and oriented x3    Data Reviewed:  I have personally reviewed following labs and imaging studies  Micro Results Recent Results (from the past 240 hour(s))  MRSA PCR Screening     Status: Abnormal   Collection Time: 06/18/16  7:11 PM  Result Value Ref Range Status   MRSA by PCR POSITIVE (A) NEGATIVE Final    Comment:        The GeneXpert MRSA Assay (FDA approved for NASAL specimens only), is one component of a comprehensive MRSA colonization surveillance program. It is not intended to diagnose MRSA infection nor to guide or monitor treatment for MRSA infections. RESULT CALLED TO, READ BACK BY AND VERIFIED WITH: [place],T RN 2132 06/18/16 [place]     Radiology Reports Dg Chest Port 1 View  Result Date: 06/18/2016 CLINICAL DATA:  60 year old female -preoperative respiratory examination prior to extremity procedure/surgery. EXAM: PORTABLE CHEST 1 VIEW COMPARISON:  01/25/2016 and prior radiographs FINDINGS: Cardiomediastinal  silhouette is unchanged with evidence of previous CABG. Unchanged left retrocardiac opacity probably represents a combination of effusion and atelectasis/scarring versus consolidation. There is no evidence of pneumothorax. There is no evidence of right effusion. IMPRESSION: Left retrocardiac opacity again noted -likely representing combination of effusion, atelectasis/ scarring and/or consolidation. No new or acute abnormalities identified otherwise. Electronically Signed   By: Margarette Canada M.D.   On: 06/18/2016 19:02  Ct Angio Chest/abd/pel For Dissection W And/or W/wo  Result Date: 06/14/2016 CLINICAL DATA:  Peripheral vascular disease. Aortobifemoral versus axillo-bifemoral bypass graft. EXAM: CT ANGIOGRAPHY CHEST, ABDOMEN AND PELVIS TECHNIQUE: Multidetector CT imaging through the chest, abdomen and pelvis was performed using the standard protocol during bolus administration of intravenous contrast. Multiplanar reconstructed images and MIPs were obtained and reviewed to evaluate the vascular anatomy. CONTRAST:  100 cc Isovue 370 COMPARISON:  01/28/2016 FINDINGS: CTA CHEST FINDINGS There is no evidence of aortic dissection or intramural hematoma. There is smooth bus circumferential soft plaque involving the distal aortic arch and descending thoracic aorta. Atherosclerotic calcifications in the arch are noted. Maximal diameter of the ascending aorta is 3.3 cm. The arch and descending aorta are non aneurysmal. The innominate artery is 1.7 cm in caliber. There is some chronic mural thrombus with calcification along the wall. It is patent. Right subclavian and common carotid arteries are patent. Right vertebral artery is patent. There is mild narrowing of the right subclavian artery at the thoracic outlet. Left common carotid artery via bovine origin is widely patent. There is some irregular plaque at the origin of the left subclavian artery without significant focal narrowing. The left vertebral artery does not  opacified. It is likely occluded. Left axillary artery is patent. No filling defect is present in the  pulmonary arterial tree to suggest acute pulmonary thromboembolism. Postoperative changes from sternotomy are noted. Internal mammary bypass graft is suspected. There is no abnormal mediastinal adenopathy. Small mediastinal nodes are stable. No pericardial effusion. Normal thyroid gland. No pneumothorax or pleural effusion. There is chronic atelectasis in the posterior basal segment of the left lower lobe. Minimal dependent atelectasis at the right base. The left hemidiaphragm is elevated. No acute bony deformity. Advanced degenerative disc disease at C5-6 and C6-7 is present. No vertebral compression deformity in the thoracic spine. T12 hemangioma Review of the MIP images confirms the above findings. CTA ABDOMEN AND PELVIS FINDINGS Aorta is non aneurysmal and patent. There is no evidence of dissection or intramural hematoma. Smooth circumferential plaque along the aorta is noted. The distal aorta is occluded just above the bifurcation. Compared to the prior study, there is some stranding within the fat surrounding the infrarenal aorta. The wall is also somewhat hazy. These findings suggest an inflammatory process. Severe narrowing in the proximal celiac axis likely due to a combination of atherosclerosis in median arcuate ligament syndrome. Branch vessels are patent. SMA is patent. IMA is patent. There is critical narrowing of the right renal artery just beyond its takeoff. There is mild plaque and narrowing at the origin of the left renal artery. The bilateral common iliac and external iliac arteries are occluded. Tiny internal iliac artery branches reconstitute. Bilateral common femoral arteries reconstitute and are diminutive. No significant narrowing in the visualize right common femoral, superficial femoral, profunda femoral arteries. There is mild diffuse narrowing of the left common femoral artery. The  proximal left superficial femoral artery is severely narrowed. Left profunda femoral artery branches are patent. Liver is unremarkable There is sludge in the gallbladder. Spleen, pancreas are within normal limits Diffuse prominence of the adrenal glands without focal nodule. Kidneys are within normal limits. No focal mass in the colon. Normal appendix. No evidence of small-bowel obstruction. No free-fluid.  There is no abnormal retroperitoneal adenopathy. Bladder is distended. Uterus is diminutive. Adnexa are unremarkable. No vertebral compression deformity. There is degenerative disc disease with an lumbar spine. It is advanced at L4-5 with vacuum. Multilevel facet arthropathy is also present. Review of the MIP images confirms the above findings. IMPRESSION: Chest: There is no evidence of thoracic aortic dissection or intramural hematoma. There is atherosclerotic plaque involving the large and descending aorta. There is plaque in the innominate artery without significant narrowing. It is mildly ectatic at 1.7 cm. Mild narrowing of the right subclavian artery at the thoracic inlet. Right axillary artery is patent Left common carotid artery is patent. Left subclavian and axillary arteries are patent. The left vertebral artery does not opacify and may be chronically occluded. Chronic elevation of left hemidiaphragm with chronic atelectasis in the posterior basal left lower lobe. Abdomen: There is no evidence of aortic aneurysm or dissection. The aorta is occluded just above the bifurcation. There are inflammatory changes involving the wall of the distal thoracic aorta and adjacent fat. Noninfectious and infectious inflammatory disorders are possibilities. Correlate clinically as for the need for blood cultures. 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 Bilateral common femoral arteries reconstitute. Significant narrowing at the origin of the left superficial femoral artery. Right renal artery stenosis.  Celiac artery stenosis. Electronically Signed   By: Marybelle Killings M.D.   On: 06/14/2016 11:41    Lab Data:  CBC:  Recent Labs Lab 06/14/16 0420 06/18/16 2128 06/20/16 0502  WBC 9.4 7.7 9.1  NEUTROABS  --  4.2  --   HGB 13.9 13.5 10.5*  HCT 43.3 42.1 33.6*  MCV 104.3* 105.0* 106.3*  PLT 179 162 [id number]*   Basic Metabolic Panel:  Recent Labs Lab 06/14/16 0420 06/18/16 2128 06/20/16 0502  NA 136 138 136  K 6.1* 5.1 4.5  CL 108 111 109  CO2 22 21* 22  GLUCOSE 113* 90 120*  BUN 33* 28* 13  CREATININE 1.22* 1.23* 1.05*  CALCIUM 9.2 8.6* 8.2*   GFR: Estimated Creatinine Clearance: 54.7 mL/min (by C-G formula based on SCr of 1.05 mg/dL). Liver Function Tests:  Recent Labs Lab 06/18/16 2128  AST 16  ALT 13*  ALKPHOS 70  BILITOT 0.4  PROT 6.7  ALBUMIN 3.3*   No results for input(s): LIPASE, AMYLASE in the last 168 hours. No results for input(s): AMMONIA in the last 168 hours. Coagulation Profile:  Recent Labs Lab 06/18/16 2128  INR 1.45   Cardiac Enzymes:  Recent Labs Lab 06/18/16 2128  CKTOTAL 43   BNP (last 3 results) No results for input(s): PROBNP in the last 8760 hours. HbA1C: No results for input(s): HGBA1C in the last 72 hours. CBG:  Recent Labs Lab 06/20/16 0817  GLUCAP 119*   Lipid Profile: No results for input(s): CHOL, HDL, LDLCALC, TRIG, CHOLHDL, LDLDIRECT in the last 72 hours. Thyroid Function Tests: No results for input(s): TSH, T4TOTAL, FREET4, T3FREE, THYROIDAB in the last 72 hours. Anemia Panel:  Recent Labs  06/18/16 2128  VITAMINB12 329   Urine analysis:    Component Value Date/Time   COLORURINE YELLOW 06/18/2016 2103   APPEARANCEUR CLEAR 06/18/2016 2103   LABSPEC 1.019 06/18/2016 2103   PHURINE 5.0 06/18/2016 2103   GLUCOSEU NEGATIVE 06/18/2016 2103   HGBUR NEGATIVE 06/18/2016 2103   BILIRUBINUR NEGATIVE 06/18/2016 2103   KETONESUR NEGATIVE 06/18/2016 2103   PROTEINUR NEGATIVE 06/18/2016 2103   NITRITE NEGATIVE  06/18/2016 2103   LEUKOCYTESUR NEGATIVE 06/18/2016 2103     RAI,RIPUDEEP M.D. [facility] 06/20/2016, 10:30 AM  Pager: [phone] Between 7am to 7pm - call Pager - [phone]  After 7pm go to [email or web address] - password [email or web address]  Call night coverage person covering after 7pm

## 2016-06-20 NOTE — Evaluation (Signed)
Physical Therapy Evaluation Patient Details Name: Sheri Peterson MRN: [id number] DOB: 04-16-1956 Today's Date: 06/20/2016   History of Present Illness  60 y.o. female admitted on 06/18/2016 S/P left axillary to bifemoral bypass graft 7/24. Pt febrile post op, possible infection?.  Clinical Impression  Patient demonstrates deficits in functional mobility as indicated below. Will need continued skilled PT to address deficits and maximize function. Will see as indicated and progress as tolerated.  OF NOTE: BP remains soft but MAP >70.  DO NOT feel patient is safe for d/c home alone as patient requires increased physical assist for all aspects of mobility at this time. Highly recommend ST SNF upon acute discharge. Patient does not appear receptive to this POC.    Follow Up Recommendations SNF;Supervision/Assistance - 24 hour (Do not feel patient safe for d/c home alone)    Equipment Recommendations  3in1 (PT)    Recommendations for Other Services       Precautions / Restrictions Precautions Precautions: Fall Precaution Comments: watch BP Restrictions Weight Bearing Restrictions: No      Mobility  Bed Mobility Overal bed mobility: Needs Assistance Bed Mobility: Rolling;Supine to Sit;Sit to Supine Rolling: Min assist   Supine to sit: Mod assist Sit to supine: Mod assist   General bed mobility comments: moderate assist to come to EOB, attempting UE support via RUE pull on rail but required increased physical assist for movement of LEs to EOB and elevation of trunk to upright. Despite cues or attempts patient unable to perform without increased level of assist.  Transfers Overall transfer level: Needs assistance Equipment used: Rolling walker (2 wheeled) Transfers: Sit to/from Stand Sit to Stand: Min assist         General transfer comment: Min assist to power up with bed in highly elevated position. Patient attempted x3 without assist and unable to perform, required  increased cues and encouragement. Assist for stability upon standing  Ambulation/Gait             General Gait Details: patient unable to perform  Stairs            Wheelchair Mobility    Modified Rankin (Stroke Patients Only)       Balance Overall balance assessment: Needs assistance Sitting-balance support: Single extremity supported;Feet supported Sitting balance-Leahy Scale: Fair     Standing balance support: Bilateral upper extremity supported;During functional activity Standing balance-Leahy Scale: Poor Standing balance comment: heavy reliance on RW, RLE buckling noted during static standing and pain in LLE impeding abiliaty to maintain standing. Patient unable to explain RLE weakness                             Pertinent Vitals/Pain Pain Assessment: 0-10 Pain Score: 10-Worst pain ever Faces Pain Scale: Hurts even more (daces indicate a 6) Pain Location: LUE LLE Pain Descriptors / Indicators: Grimacing;Guarding;Moaning;Operative site guarding Pain Intervention(s): Limited activity within patient's tolerance;Monitored during session;Repositioned    Home Living Family/patient expects to be discharged to:: Private residence Living Arrangements: Alone Available Help at Discharge: Other (Comment) (unsure, maybe brother, maybe friend) Type of Home: House Home Access: Stairs to enter Entrance Stairs-Rails: None Entrance Stairs-Number of Steps: 2 Home Layout: One level Home Equipment: Walker - 4 wheels      Prior Function Level of Independence: Independent with assistive device(s)         Comments: states that she uses a rollator at [place]  Dominant Hand: Right    Extremity/Trunk Assessment   Upper Extremity Assessment: LUE deficits/detail       LUE Deficits / Details: decreased strength   Lower Extremity Assessment: Generalized weakness;RLE deficits/detail;LLE deficits/detail   LLE Deficits / Details:  limited strength and ROM against gravity  Cervical / Trunk Assessment: Kyphotic  Communication   Communication: No difficulties  Cognition Arousal/Alertness: Awake/alert Behavior During Therapy: Anxious Overall Cognitive Status: Impaired/Different from baseline Area of Impairment: Safety/judgement;Awareness;Problem solving         Safety/Judgement: Decreased awareness of safety;Decreased awareness of deficits Awareness: Emergent Problem Solving: Decreased initiation;Difficulty sequencing;Requires verbal cues;Requires tactile cues General Comments: Concerned regarding patient perception and response to safety awareness and insight.     General Comments      Exercises        Assessment/Plan    PT Assessment Patient needs continued PT services  PT Diagnosis Difficulty walking;Abnormality of gait;Generalized weakness;Acute pain   PT Problem List Decreased strength;Decreased activity tolerance;Decreased balance;Decreased mobility;Decreased coordination;Cardiopulmonary status limiting activity;Pain  PT Treatment Interventions DME instruction;Gait training;Stair training;Functional mobility training;Therapeutic activities;Therapeutic exercise;Balance training;Patient/family education   PT Goals (Current goals can be found in the Care Plan section) Acute Rehab PT Goals Patient Stated Goal: to go home PT Goal Formulation: With patient Time For Goal Achievement: 07/04/16 Potential to Achieve Goals: Good    Frequency Min 3X/week   Barriers to discharge        Co-evaluation               End of Session Equipment Utilized During Treatment: Gait belt Activity Tolerance: Patient limited by fatigue;Patient limited by pain Patient left: in bed;with call bell/phone within reach;with bed alarm set Nurse Communication: Mobility status         Time: [id number] PT Time Calculation (min) (ACUTE ONLY): 18 min   Charges:   PT Evaluation $PT Eval Moderate Complexity: 1  Procedure     PT G CodesDuncan Dull 07/16/16, 2:37 PM Alben Deeds, [place] DPT  [phone]

## 2016-06-20 NOTE — Progress Notes (Signed)
500 cc bolus started

## 2016-06-21 ENCOUNTER — Inpatient Hospital Stay (HOSPITAL_COMMUNITY): Payer: Medicare (Managed Care)

## 2016-06-21 DIAGNOSIS — M545 Low back pain: Secondary | ICD-10-CM

## 2016-06-21 DIAGNOSIS — G8929 Other chronic pain: Secondary | ICD-10-CM

## 2016-06-21 DIAGNOSIS — N183 Chronic kidney disease, stage 3 (moderate): Secondary | ICD-10-CM

## 2016-06-21 DIAGNOSIS — I739 Peripheral vascular disease, unspecified: Principal | ICD-10-CM

## 2016-06-21 DIAGNOSIS — M79672 Pain in left foot: Secondary | ICD-10-CM

## 2016-06-21 DIAGNOSIS — K219 Gastro-esophageal reflux disease without esophagitis: Secondary | ICD-10-CM

## 2016-06-21 DIAGNOSIS — I998 Other disorder of circulatory system: Secondary | ICD-10-CM

## 2016-06-21 DIAGNOSIS — Z720 Tobacco use: Secondary | ICD-10-CM

## 2016-06-21 DIAGNOSIS — D7589 Other specified diseases of blood and blood-forming organs: Secondary | ICD-10-CM

## 2016-06-21 DIAGNOSIS — I999 Unspecified disorder of circulatory system: Secondary | ICD-10-CM

## 2016-06-21 DIAGNOSIS — E875 Hyperkalemia: Secondary | ICD-10-CM

## 2016-06-21 DIAGNOSIS — B20 Human immunodeficiency virus [HIV] disease: Secondary | ICD-10-CM

## 2016-06-21 LAB — GLUCOSE, CAPILLARY
GLUCOSE-CAPILLARY: 101 mg/dL — AB (ref 65–99)
GLUCOSE-CAPILLARY: 131 mg/dL — AB (ref 65–99)

## 2016-06-21 LAB — CBC
HCT: 32.6 % — ABNORMAL LOW (ref 36.0–46.0)
HEMOGLOBIN: 10.3 g/dL — AB (ref 12.0–15.0)
MCH: 33.9 pg (ref 26.0–34.0)
MCHC: 31.6 g/dL (ref 30.0–36.0)
MCV: 107.2 fL — ABNORMAL HIGH (ref 78.0–100.0)
Platelets: 108 10*3/uL — ABNORMAL LOW (ref 150–400)
RBC: 3.04 MIL/uL — AB (ref 3.87–5.11)
RDW: 14.9 % (ref 11.5–15.5)
WBC: 10.9 10*3/uL — ABNORMAL HIGH (ref 4.0–10.5)

## 2016-06-21 NOTE — Clinical Social Work Note (Addendum)
PT recommending SNF. Per RNCM note, patient is refusing due to having [facility] MWF from 10 am-5 pm but after speaking with RNCM, she can go there M-F 10 am-5 pm and get PT there. Patient lives alone but friend will stay with her for a few days and her brother assists with groceries.   CSW will sign off. Consult again if any other social work needs arise.  Dayton Scrape, [place] [phone]  2:55 pm Tacy Dura at [facility] called CSW requesting PT notes to be faxed over. CSW told Ms. Edsel Petrin that [place] will ask Surveyor, quantity of social work if this is compliant with HIPAA. Surveyor, quantity preferred that Ms. Edsel Petrin obtain records through medical records. CSW called and left voicemail with this information and left CSW phone number in case medical records needed confirmation.  Dayton Scrape, [place]

## 2016-06-21 NOTE — Progress Notes (Signed)
Physical Therapy Treatment Patient Details Name: CAMRIE ROTTMANN MRN: [id number] DOB: 1955/12/13 Today's Date: 06/21/2016    History of Present Illness 60 y.o. female admitted on 06/18/2016 S/P left axillary to bifemoral bypass graft 7/24. Pt febrile post op, possible infection?.    PT Comments    Patient seen in conjunction with OT therapy due to patient safety as well as questionable ability to tolerate separate sessions [id number] 9/10 and wants to take a nap. Patient continues to required physical assist and cues for safety and awareness. Patient ambulated in room with RW and assist, tolerated modest time at EOB and returned to supine with assist. At this time, continue to highly recommend ST SNF upon acute discharge as patient has yet to demonstrate ability to care for herself without physical assist, however, patient remains reluctant. Will continue to see as indicated.  Follow Up Recommendations  SNF;Supervision/Assistance - 24 hour (Do not feel patient safe for d/c home alone)     Equipment Recommendations  3in1 (PT)    Recommendations for Other Services       Precautions / Restrictions Precautions Precautions: Fall Precaution Comments: watch BP Restrictions Weight Bearing Restrictions: No    Mobility  Bed Mobility Overal bed mobility: Needs Assistance Bed Mobility: Rolling;Supine to Sit;Sit to Supine Rolling: Min assist   Supine to sit: Mod assist Sit to supine: Min assist   General bed mobility comments: Pt c/o pain during bed mobility requirning assist to bring trunk from sidelying to sit. Assist for LE elevation into bed.  Transfers Overall transfer level: Needs assistance Equipment used: Rolling walker (2 wheeled) Transfers: Sit to/from Stand Sit to Stand: Min assist;+2 physical assistance         General transfer comment: Min assist +2 to boost up from EOB, min assist +1 from toilet. VCs for hand placement and technique but pt not listening to instructions  and safety precations.  Ambulation/Gait Ambulation/Gait assistance: Min assist Ambulation Distance (Feet): 18 Feet Assistive device: Rolling walker (2 wheeled) Gait Pattern/deviations: Step-through pattern;Decreased stride length;Shuffle;Antalgic;Trunk flexed Gait velocity: decreased Gait velocity interpretation: Below normal speed for age/gender General Gait Details: patient with LE flexed and self limiting WBIng through LLE during ambulation, patient required multi modal cues for safety. VCs for upright posture and use of RW   Stairs            Wheelchair Mobility    Modified Rankin (Stroke Patients Only)       Balance Overall balance assessment: Needs assistance Sitting-balance support: Single extremity supported Sitting balance-Leahy Scale: Fair     Standing balance support: Bilateral upper extremity supported Standing balance-Leahy Scale: Poor Standing balance comment: heavy reliance on RW                    Cognition Arousal/Alertness: Awake/alert Behavior During Therapy: Anxious Overall Cognitive Status: Impaired/Different from baseline Area of Impairment: Safety/judgement;Awareness;Problem solving         Safety/Judgement: Decreased awareness of safety;Decreased awareness of deficits Awareness: Emergent Problem Solving: Decreased initiation;Difficulty sequencing;Requires verbal cues;Requires tactile cues General Comments: patient very tangential with difficulty directing to task. Multimodal cues for encouragement and participation. Patient continues to demonstrate deficits in safety awareness regarding function mobility. Patient requires assist for mobility (some self limiting due to pain) but indicates that she can perform these tasks on her own, just "not right now"    Exercises      General Comments General comments (skin integrity, edema, etc.): BP assessed remains soft with Maps 61 and  69.      Pertinent Vitals/Pain Pain Assessment:  0-10 Pain Score: 9  Pain Location: hip, back, LUE Pain Descriptors / Indicators: Aching;Grimacing;Guarding Pain Intervention(s): Monitored during session    Home Living Family/patient expects to be discharged to:: Private residence Living Arrangements: Alone Available Help at Discharge: Family;Available 24 hours/day (reports she will be staying with sister and brother in law) Type of Home: Apartment Home Access: Stairs to enter Entrance Stairs-Rails: None Home Layout: One level Home Equipment: Walker - 4 wheels;Grab bars - tub/shower;Shower seat      Prior Function Level of Independence: Independent with assistive device(s)      Comments: states that she uses a rollator at baseline. independent with ADL   PT Goals (current goals can now be found in the care plan section) Acute Rehab PT Goals Patient Stated Goal: to go home PT Goal Formulation: With patient Time For Goal Achievement: 07/04/16 Potential to Achieve Goals: Good Progress towards PT goals: Progressing toward goals (modestly )    Frequency  Min 3X/week    PT Plan Current plan remains appropriate    Co-evaluation PT/OT/SLP Co-Evaluation/Treatment: Yes Reason for Co-Treatment: Complexity of the patient's impairments (multi-system involvement);For patient/therapist safety PT goals addressed during session: Mobility/safety with mobility OT goals addressed during session: ADL's and self-care     End of Session Equipment Utilized During Treatment: Gait belt Activity Tolerance: Patient limited by fatigue;Patient limited by pain Patient left: in bed;with call bell/phone within reach;with bed alarm set     Time: [id number] PT Time Calculation (min) (ACUTE ONLY): 30 min  Charges:  $Therapeutic Activity: 8-22 mins                    G CodesDuncan Dull 07/05/2016, 3:26 PM Alben Deeds, [place] DPT  [phone]

## 2016-06-21 NOTE — Evaluation (Signed)
Occupational Therapy Evaluation Patient Details Name: Sheri Peterson MRN: [id number] DOB: March 16, 1956 Today's Date: 06/21/2016    History of Present Illness 60 y.o. female admitted on 06/18/2016 S/P left axillary to bifemoral bypass graft 7/24. Pt febrile post op, possible infection?.   Clinical Impression   Pt reports she was independent with ADL PTA. Currently pt requires min assist overall for functional mobility and ADL. Pt c/o pain throughout session and requires consistent cueing for safety and awareness. Feel pt would benefit from short term SNF for continued rehab prior to return home due to pt unable to demonstrate ability to safely perform self care and functional mobility without assist this session. Pt would benefit from continued skilled OT to address established goals.    Follow Up Recommendations  SNF;Supervision/Assistance - 24 hour    Equipment Recommendations  Other (comment) (TBD at next venue)    Recommendations for Other Services       Precautions / Restrictions Precautions Precautions: Fall Precaution Comments: watch BP Restrictions Weight Bearing Restrictions: No      Mobility Bed Mobility Overal bed mobility: Needs Assistance Bed Mobility: Rolling;Supine to Sit;Sit to Supine Rolling: Min assist   Supine to sit: Mod assist Sit to supine: Min assist   General bed mobility comments: Pt c/o pain during bed mobility requirning assist to bring trunk from sidelying to sit. Assist for LE elevation into bed.  Transfers Overall transfer level: Needs assistance Equipment used: Rolling walker (2 wheeled) Transfers: Sit to/from Stand Sit to Stand: Min assist;+2 physical assistance         General transfer comment: Min assist +2 to boost up from EOB, min assist +1 from toilet. VCs for hand placement and technique but pt not listening to instructions and safety precations.    Balance Overall balance assessment: Needs assistance Sitting-balance support:  Single extremity supported Sitting balance-Leahy Scale: Fair     Standing balance support: Bilateral upper extremity supported Standing balance-Leahy Scale: Poor Standing balance comment: relies on RW                            ADL Overall ADL's : Needs assistance/impaired Eating/Feeding: Set up;Sitting   Grooming: Minimal assistance;Standing Grooming Details (indicate cue type and reason): assist for balance in standing Upper Body Bathing: Minimal assitance;Sitting   Lower Body Bathing: Minimal assistance;Sit to/from stand   Upper Body Dressing : Minimal assistance;Sitting   Lower Body Dressing: Minimal assistance;Sit to/from stand Lower Body Dressing Details (indicate cue type and reason): Pt able to pull up socks at [employer] Transfer: Minimal assistance;Ambulation;+2 for safety/equipment;RW;Regular Toilet;Grab bars Toilet Transfer Details (indicate cue type and reason): VCs to back up to toilet prior to sitting down, reaching back for toilet but pt not listening to instructions. Toileting- Clothing Manipulation and Hygiene: Minimal assistance;Sit to/from stand       Functional mobility during ADLs: Minimal assistance;+2 for safety/equipment;Rolling walker General ADL Comments: Min assist +2 for initial sit to stand from EOB, min assist from toilet. Pt with decreased safety awareness and is self limiting; requires max verbal encouragement to participate in therapy. Pt not listening to safety instructions during functional activities.     Vision Additional Comments: Appears WFL.   Perception     Praxis      Pertinent Vitals/Pain Pain Assessment: 0-10 Pain Score: 9  Pain Location: hip, back, LUE Pain Descriptors / Indicators: Aching;Grimacing;Guarding Pain Intervention(s): Monitored during session     Hand Dominance Right  Extremity/Trunk Assessment Upper Extremity Assessment Upper Extremity Assessment: LUE deficits/detail LUE Deficits /  Details: decreased strength LUE: Unable to fully assess due to pain   Lower Extremity Assessment Lower Extremity Assessment: Defer to PT evaluation   Cervical / Trunk Assessment Cervical / Trunk Assessment: Kyphotic   Communication Communication Communication: No difficulties   Cognition Arousal/Alertness: Awake/alert Behavior During Therapy: Anxious Overall Cognitive Status: Impaired/Different from baseline Area of Impairment: Safety/judgement;Awareness;Problem solving         Safety/Judgement: Decreased awareness of safety;Decreased awareness of deficits Awareness: Emergent Problem Solving: Decreased initiation;Difficulty sequencing;Requires verbal cues;Requires tactile cues General Comments: patient very tangential with difficulty directing to task. Multimodal cues for encouragement and participation. Patient continues to demonstrate deficits in safety awareness regarding function mobility. Patient requires assist for mobility (some self limiting due to pain) but indicates that she can perform these tasks on her own, just "not right now"   General Comments       Exercises       Shoulder Instructions      Home Living Family/patient expects to be discharged to:: Private residence Living Arrangements: Alone Available Help at Discharge: Family;Available 24 hours/day (reports she will be staying with sister and brother in law) Type of Home: Apartment Home Access: Stairs to enter Entrance Stairs-Number of Steps: 2 Entrance Stairs-Rails: None Home Layout: One level     Bathroom Shower/Tub: Tub/shower unit Shower/tub characteristics: Curtain Biochemist, clinical: Standard     Home Equipment: Environmental consultant - 4 wheels;Grab bars - tub/shower;Shower seat          Prior Functioning/Environment Level of Independence: Independent with assistive device(s)        Comments: states that she uses a rollator at baseline. independent with ADL    OT Diagnosis: Generalized weakness;Acute  pain;Altered mental status   OT Problem List: Decreased strength;Decreased activity tolerance;Impaired balance (sitting and/or standing);Decreased cognition;Decreased safety awareness;Decreased knowledge of use of DME or AE;Decreased knowledge of precautions;Pain   OT Treatment/Interventions: Self-care/ADL training;Energy conservation;DME and/or AE instruction;Therapeutic activities;Cognitive remediation/compensation;Patient/family education;Balance training    OT Goals(Current goals can be found in the care plan section) Acute Rehab OT Goals Patient Stated Goal: to go home OT Goal Formulation: With patient Time For Goal Achievement: 07/05/16 Potential to Achieve Goals: Good ADL Goals Pt Will Perform Grooming: with modified independence;standing Pt Will Perform Upper Body Bathing: with modified independence;sitting Pt Will Perform Lower Body Bathing: with modified independence;sit to/from stand Pt Will Transfer to Toilet: with modified independence;ambulating;regular height toilet Pt Will Perform Toileting - Clothing Manipulation and hygiene: with modified independence;sit to/from stand  OT Frequency: Min 2X/week   Barriers to D/C:            Co-evaluation PT/OT/SLP Co-Evaluation/Treatment: Yes Reason for Co-Treatment: Complexity of the patient's impairments (multi-system involvement);For patient/therapist safety PT goals addressed during session: Mobility/safety with mobility OT goals addressed during session: ADL's and self-care      End of Session Equipment Utilized During Treatment: Gait belt;Rolling walker  Activity Tolerance: Patient limited by pain;Patient tolerated treatment well Patient left: in bed;with call bell/phone within reach;with bed alarm set   Time: [id number] OT Time Calculation (min): 30 min Charges:  OT General Charges $OT Visit: 1 Procedure OT Evaluation $OT Eval Moderate Complexity: 1 Procedure G-Codes:     Binnie Kand M.S., OTR/L Pager:  [phone]  06/21/2016, 4:12 PM

## 2016-06-21 NOTE — Progress Notes (Signed)
Pt transferring to [id number] via bed with belongings, report given to [facility] RN

## 2016-06-21 NOTE — Progress Notes (Signed)
Attempted to call report. Nurse in contact room will call back when done.

## 2016-06-21 NOTE — Progress Notes (Signed)
VASCULAR LAB PRELIMINARY  ARTERIAL  ABI completed: Right sided ABI is suggestive of mild arterial insufficiency at rest. Left sided ABI is suggestive of moderate arterial insufficiency at rest.     RIGHT    LEFT    PRESSURE WAVEFORM  PRESSURE WAVEFORM  BRACHIAL 119 Triphasic BRACHIAL IV Triphasic  DP 104 biphasic DP 68 monophasic  PT 110 biphasic PT 68 monophasic  GREAT TOE  NA GREAT TOE  NA    RIGHT LEFT  ABI 0.92 0.57     Sheri Peterson, RVT 06/21/2016, 4:06 PM

## 2016-06-21 NOTE — Progress Notes (Signed)
PROGRESS NOTE    Sheri Peterson  [id number] DOB: [birth year] DOA: 06/18/2016 PCP: Pcp Not In System    Brief Narrative:  Patient is 60 year old female with HIV, CAD status post CABG in February, hypertension, depression, and peripheral arterial disease with occlusions and bilateral iliacs and recent unsuccessful attempt at angioplasty, now under consideration for bypass, presenting in transfer from [facility] for evaluation of left foot pain of 6 days' duration suspected secondary to critical ischemia. Patient reported years long history of claudication and has been under the care of vascular surgery. With worsening symptoms, she underwent lower extremity angiography on 06/13/2016, but unfortunately the bilateral iliacs had occlusions it cannot be recanalized.She reported stable claudication in the right leg, but notes the excruciating pain in the left foot developing over the past 6 days at rest Upon arrival to the [facility], patient is found to be afebrile, saturating well on room air, with blood pressure 109/56, and vitals otherwise stable. She had a CMP which is notable for serum potassium of 5.7, BUN of 33, and serum creatinine 1.30, slightly up from an apparent baseline of 1.2. CBC features a macrocytosis with MCV of 104, but with remaining indices within the normal limits. Vascular surgery was consulted by the ED physician at [facility] and transferred to [facility] for possible surgical intervention was advised.  Assessment & Plan   SeverePAD with left foot ischemia  -Had LE angiography on 06/13/16 with occlusion of b/l iliacs, unable to be recanalized  -Has been on Plavix and ASA 81 mg daily and was placed on heparin drip. Currently on lovenox -Vascular surgery consulted, patient underwent left axillary to femoral and femoral to femoral bypass on 7/24  Fevers/sirs: -Patient was spiking fevers this morning, temp 101.75F, BP low 93/59, heart rate 91.  -blood cultures  negative to date -Started on vancomycin and zosyn -No fevers today, however BP soft. -UA negative for UTI on 7/24.  Hyperkalemia -Resolved, continue to hold ACEi  Mild acute on CKD stage III resolved -SCr 1.30 on admission, slightly up from apparent baseline of ~1.20 , 1.5 on 7/18 -Currently 1.05 -Holding lisinopril in setting of hyperkalemia, AKI and hypotension   Hypertension  -Currently BP low, likely due to above -continue to hold lisinopril, placed on IV fluids    HIV  -Continue current management with Triumeq   GERD -Managed with Zantac at home -Continue H2-blocker with Pepcid while in hospital    Macrocytosis  -MCV 104 with normal H&H; MCV had been wnl in 2015  -B12 and folate normal  DVT Prophylaxis  Lovenox  Code Status: Full  Family Communication: None at bedside  Disposition Plan: Admitted. Will transfer to tele today  Consultants Vascular surgery  Procedures  Left axillary to femoral and femoral to femoral bypass with 8 mm Hemashield graft  Antibiotics   Anti-infectives    Start     Dose/Rate Route Frequency Ordered Stop   06/20/16 2000  abacavir-dolutegravir-lamiVUDine (TRIUMEQ) 600-50-300 MG per tablet 1 tablet     1 tablet Oral Daily 06/20/16 0821     06/20/16 1200  vancomycin (VANCOCIN) 500 mg in sodium chloride 0.9 % 100 mL IVPB     500 mg 100 mL/hr over 60 Minutes Intravenous Every 12 hours 06/20/16 1056     06/20/16 1130  piperacillin-tazobactam (ZOSYN) IVPB 3.375 g     3.375 g 12.5 mL/hr over 240 Minutes Intravenous Every 8 hours 06/20/16 1056     06/20/16 0700  cefUROXime (ZINACEF) 1.5 g  in dextrose 5 % 50 mL IVPB     1.5 g 100 mL/hr over 30 Minutes Intravenous To Short Stay 06/19/16 0820 06/19/16 1237   06/19/16 2300  cefUROXime (ZINACEF) 1.5 g in dextrose 5 % 50 mL IVPB  Status:  Discontinued     1.5 g 100 mL/hr over 30 Minutes Intravenous Every 12 hours 06/19/16 1850 06/20/16 1047   06/19/16 1000   abacavir-dolutegravir-lamiVUDine (TRIUMEQ) [id number] MG per tablet 1 tablet  Status:  Discontinued     1 tablet Oral Daily 06/18/16 1855 06/20/16 0821      Subjective:   Sheri Peterson seen and examined today.  Denies chest pain, shortness of breath, abdominal pain, nausea, vomiting, diarrhea, constipation, dizziness or headache. Does continue to complain of leg pain.  Objective:   Vitals:   06/21/16 0500 06/21/16 0710 06/21/16 1146 06/21/16 1310  BP:  (!) 107/57 (!) 87/63 108/63  Pulse:  86 84   Resp:  (!) 27 17 (!) 21  Temp:  99.8 F (37.7 C) 99.3 F (37.4 C)   TempSrc:  Oral Oral   SpO2:  100% 95% 97%  Weight: 69.4 kg (153 lb)     Height:        Intake/Output Summary (Last 24 hours) at 06/21/16 1422 Last data filed at 06/21/16 0716  Gross per 24 hour  Intake          1661.67 ml  Output             1350 ml  Net           311.67 ml   Filed Weights   06/19/16 1842 06/20/16 0500 06/21/16 0500  Weight: 63 kg (138 lb 14.2 oz) 68 kg (149 lb 14.6 oz) 69.4 kg (153 lb)    Exam  General: Well developed, well nourished, NAD, appears stated age  HEENT: NCAT,  mucous membranes moist.   Cardiovascular: S1 S2 auscultated, no rubs, murmurs or gallops. Regular rate and rhythm.  Respiratory: Clear to auscultation bilaterally with equal chest rise  Abdomen: Soft, nontender, nondistended, + bowel sounds  Extremities: warm dry without cyanosis clubbing or edema. Groin incision.   Neuro: AAOx3, nonfocal  Psych: Normal affect and demeanor    Data Reviewed: I have personally reviewed following labs and imaging studies  CBC:  Recent Labs Lab 06/18/16 2128 06/20/16 0502 06/21/16 0842  WBC 7.7 9.1 10.9*  NEUTROABS 4.2  --   --   HGB 13.5 10.5* 10.3*  HCT 42.1  39.4 33.6* 32.6*  MCV 105.0* 106.3* 107.2*  PLT 162 121* 123XX123*   Basic Metabolic Panel:  Recent Labs Lab 06/18/16 2128 06/20/16 0502  NA 138 136  K 5.1 4.5  CL 111 109  CO2 21* 22  GLUCOSE 90 120*  BUN  28* 13  CREATININE 1.23* 1.05*  CALCIUM 8.6* 8.2*   GFR: Estimated Creatinine Clearance: 54.7 mL/min (by C-G formula based on SCr of 1.05 mg/dL). Liver Function Tests:  Recent Labs Lab 06/18/16 2128  AST 16  ALT 13*  ALKPHOS 70  BILITOT 0.4  PROT 6.7  ALBUMIN 3.3*   No results for input(s): LIPASE, AMYLASE in the last 168 hours. No results for input(s): AMMONIA in the last 168 hours. Coagulation Profile:  Recent Labs Lab 06/18/16 2128  INR 1.45   Cardiac Enzymes:  Recent Labs Lab 06/18/16 2128  CKTOTAL 43   BNP (last 3 results) No results for input(s): PROBNP in the last 8760 hours. HbA1C: No results for input(s): HGBA1C  in the last 72 hours. CBG:  Recent Labs Lab 06/20/16 0817 06/21/16 0732 06/21/16 1143  GLUCAP 119* 101* 131*   Lipid Profile: No results for input(s): CHOL, HDL, LDLCALC, TRIG, CHOLHDL, LDLDIRECT in the last 72 hours. Thyroid Function Tests: No results for input(s): TSH, T4TOTAL, FREET4, T3FREE, THYROIDAB in the last 72 hours. Anemia Panel:  Recent Labs  06/18/16 2128  VITAMINB12 329   Urine analysis:    Component Value Date/Time   COLORURINE YELLOW 06/18/2016 2103   APPEARANCEUR CLEAR 06/18/2016 2103   LABSPEC 1.019 06/18/2016 2103   PHURINE 5.0 06/18/2016 2103   GLUCOSEU NEGATIVE 06/18/2016 2103   HGBUR NEGATIVE 06/18/2016 2103   BILIRUBINUR NEGATIVE 06/18/2016 2103   KETONESUR NEGATIVE 06/18/2016 2103   PROTEINUR NEGATIVE 06/18/2016 2103   NITRITE NEGATIVE 06/18/2016 2103   LEUKOCYTESUR NEGATIVE 06/18/2016 2103   Sepsis Labs: @LABRCNTIP (procalcitonin:4,lacticidven:4)  ) Recent Results (from the past 240 hour(s))  MRSA PCR Screening     Status: Abnormal   Collection Time: 06/18/16  7:11 PM  Result Value Ref Range Status   MRSA by PCR POSITIVE (A) NEGATIVE Final    Comment:        The GeneXpert MRSA Assay (FDA approved for NASAL specimens only), is one component of a comprehensive MRSA colonization surveillance  program. It is not intended to diagnose MRSA infection nor to guide or monitor treatment for MRSA infections. RESULT CALLED TO, READ BACK BY AND VERIFIED WITH: [place],T RN 2132 06/18/16 MITCHELL,L   Culture, blood (routine x 2)     Status: None (Preliminary result)   Collection Time: 06/20/16 12:40 PM  Result Value Ref Range Status   Specimen Description BLOOD RIGHT HAND  Final   Special Requests IN PEDIATRIC BOTTLE  3ML  Final   Culture NO GROWTH <12 HOURS  Final   Report Status PENDING  Incomplete  Culture, blood (routine x 2)     Status: None (Preliminary result)   Collection Time: 06/20/16 12:46 PM  Result Value Ref Range Status   Specimen Description BLOOD LEFT HAND  Final   Special Requests IN PEDIATRIC BOTTLE  3ML  Final   Culture NO GROWTH <12 HOURS  Final   Report Status PENDING  Incomplete      Radiology Studies: No results found.   Scheduled Meds: . abacavir-dolutegravir-lamiVUDine  1 tablet Oral Q2000  . aspirin EC  81 mg Oral Daily  . Chlorhexidine Gluconate Cloth  6 each Topical Q0600  . cholecalciferol  5,000 Units Oral Daily  . clopidogrel  75 mg Oral Daily  . docusate sodium  100 mg Oral Daily  . doxepin  50 mg Oral QHS  . enoxaparin (LOVENOX) injection  30 mg Subcutaneous Q24H  . famotidine  20 mg Oral Daily  . FLUoxetine  20 mg Oral Daily  . gabapentin  600 mg Oral TID  . lidocaine  1 patch Transdermal Q24H  . mupirocin ointment  1 application Nasal BID  . pantoprazole  40 mg Oral Daily  . piperacillin-tazobactam (ZOSYN)  IV  3.375 g Intravenous Q8H  . rosuvastatin  10 mg Oral Daily  . sodium chloride flush  3 mL Intravenous Q12H  . vancomycin  500 mg Intravenous Q12H   Continuous Infusions: . sodium chloride 100 mL/hr at 06/20/16 1127     LOS: 3 days   Time Spent in minutes  30 minutes  Dalaya Suppa D.O. on 06/21/2016 at 2:22 PM  Between 7am to 7pm - Pager - [phone]  After 7pm go  to [email or web address] - password [email or web address]  And look  for the night coverage person covering for me after hours  [facility] Office  [phone]

## 2016-06-21 NOTE — Progress Notes (Signed)
Subjective: Interval History: none.. Reports resolution of left foot pain. Motor and sensory intact in both feet. Still with pain in her groin incisions and axillary incision   Objective: Vital signs in last 24 hours: Temp:  [97.5 F (36.4 C)-100.1 F (37.8 C)] 99.8 F (37.7 C) (07/26 0710) Pulse Rate:  [76-95] 86 (07/26 0710) Resp:  [12-28] 27 (07/26 0710) BP: (79-122)/(53-90) 107/57 (07/26 0710) SpO2:  [57 %-100 %] 100 % (07/26 0710) Weight:  [153 lb (69.4 kg)] 153 lb (69.4 kg) (07/26 0500)  Intake/Output from previous day: 07/25 0701 - 07/26 0700 In: 2913.3 [P.O.:960; I.V.:1703.3; IV Piggyback:250] Out: 1550 [Urine:1550] Intake/Output this shift: Total I/O In: 240 [P.O.:240] Out: 300 [Urine:300]  Axillary and groin incisions healing nicely. 2-3+ asked him in femorofemoral pulse. Feet well-perfused. Cyanosis resolved and left toe  Lab Results:  Recent Labs  06/18/16 2128 06/20/16 0502  WBC 7.7 9.1  HGB 13.5 10.5*  HCT 42.1  39.4 33.6*  PLT 162 121*   BMET  Recent Labs  06/18/16 2128 06/20/16 0502  NA 138 136  K 5.1 4.5  CL 111 109  CO2 21* 22  GLUCOSE 90 120*  BUN 28* 13  CREATININE 1.23* 1.05*  CALCIUM 8.6* 8.2*    Studies/Results: Dg Chest Port 1 View  Result Date: 06/18/2016 CLINICAL DATA:  60 year old female -preoperative respiratory examination prior to extremity procedure/surgery. EXAM: PORTABLE CHEST 1 VIEW COMPARISON:  01/25/2016 and prior radiographs FINDINGS: Cardiomediastinal silhouette is unchanged with evidence of previous CABG. Unchanged left retrocardiac opacity probably represents a combination of effusion and atelectasis/scarring versus consolidation. There is no evidence of pneumothorax. There is no evidence of right effusion. IMPRESSION: Left retrocardiac opacity again noted -likely representing combination of effusion, atelectasis/ scarring and/or consolidation. No new or acute abnormalities identified otherwise. Electronically Signed    By: Margarette Canada M.D.   On: 06/18/2016 19:02  Ct Angio Chest/abd/pel For Dissection W And/or W/wo  Result Date: 06/14/2016 CLINICAL DATA:  Peripheral vascular disease. Aortobifemoral versus axillo-bifemoral bypass graft. EXAM: CT ANGIOGRAPHY CHEST, ABDOMEN AND PELVIS TECHNIQUE: Multidetector CT imaging through the chest, abdomen and pelvis was performed using the standard protocol during bolus administration of intravenous contrast. Multiplanar reconstructed images and MIPs were obtained and reviewed to evaluate the vascular anatomy. CONTRAST:  100 cc Isovue 370 COMPARISON:  01/28/2016 FINDINGS: CTA CHEST FINDINGS There is no evidence of aortic dissection or intramural hematoma. There is smooth bus circumferential soft plaque involving the distal aortic arch and descending thoracic aorta. Atherosclerotic calcifications in the arch are noted. Maximal diameter of the ascending aorta is 3.3 cm. The arch and descending aorta are non aneurysmal. The innominate artery is 1.7 cm in caliber. There is some chronic mural thrombus with calcification along the wall. It is patent. Right subclavian and common carotid arteries are patent. Right vertebral artery is patent. There is mild narrowing of the right subclavian artery at the thoracic outlet. Left common carotid artery via bovine origin is widely patent. There is some irregular plaque at the origin of the left subclavian artery without significant focal narrowing. The left vertebral artery does not opacified. It is likely occluded. Left axillary artery is patent. No filling defect is present in the pulmonary arterial tree to suggest acute pulmonary thromboembolism. Postoperative changes from sternotomy are noted. Internal mammary bypass graft is suspected. There is no abnormal mediastinal adenopathy. Small mediastinal nodes are stable. No pericardial effusion. Normal thyroid gland. No pneumothorax or pleural effusion. There is chronic atelectasis in the posterior basal  segment of the left lower lobe. Minimal dependent atelectasis at the right base. The left hemidiaphragm is elevated. No acute bony deformity. Advanced degenerative disc disease at C5-6 and C6-7 is present. No vertebral compression deformity in the thoracic spine. T12 hemangioma Review of the MIP images confirms the above findings. CTA ABDOMEN AND PELVIS FINDINGS Aorta is non aneurysmal and patent. There is no evidence of dissection or intramural hematoma. Smooth circumferential plaque along the aorta is noted. The distal aorta is occluded just above the bifurcation. Compared to the prior study, there is some stranding within the fat surrounding the infrarenal aorta. The wall is also somewhat hazy. These findings suggest an inflammatory process. Severe narrowing in the proximal celiac axis likely due to a combination of atherosclerosis in median arcuate ligament syndrome. Branch vessels are patent. SMA is patent. IMA is patent. There is critical narrowing of the right renal artery just beyond its takeoff. There is mild plaque and narrowing at the origin of the left renal artery. The bilateral common iliac and external iliac arteries are occluded. Tiny internal iliac artery branches reconstitute. Bilateral common femoral arteries reconstitute and are diminutive. No significant narrowing in the visualize right common femoral, superficial femoral, profunda femoral arteries. There is mild diffuse narrowing of the left common femoral artery. The proximal left superficial femoral artery is severely narrowed. Left profunda femoral artery branches are patent. Liver is unremarkable There is sludge in the gallbladder. Spleen, pancreas are within normal limits Diffuse prominence of the adrenal glands without focal nodule. Kidneys are within normal limits. No focal mass in the colon. Normal appendix. No evidence of small-bowel obstruction. No free-fluid.  There is no abnormal retroperitoneal adenopathy. Bladder is distended.  Uterus is diminutive. Adnexa are unremarkable. No vertebral compression deformity. There is degenerative disc disease with an lumbar spine. It is advanced at L4-5 with vacuum. Multilevel facet arthropathy is also present. Review of the MIP images confirms the above findings. IMPRESSION: Chest: There is no evidence of thoracic aortic dissection or intramural hematoma. There is atherosclerotic plaque involving the large and descending aorta. There is plaque in the innominate artery without significant narrowing. It is mildly ectatic at 1.7 cm. Mild narrowing of the right subclavian artery at the thoracic inlet. Right axillary artery is patent Left common carotid artery is patent. Left subclavian and axillary arteries are patent. The left vertebral artery does not opacify and may be chronically occluded. Chronic elevation of left hemidiaphragm with chronic atelectasis in the posterior basal left lower lobe. Abdomen: There is no evidence of aortic aneurysm or dissection. The aorta is occluded just above the bifurcation. There are inflammatory changes involving the wall of the distal thoracic aorta and adjacent fat. Noninfectious and infectious inflammatory disorders are possibilities. Correlate clinically as for the need for blood cultures. 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 Bilateral common femoral arteries reconstitute. Significant narrowing at the origin of the left superficial femoral artery. 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 Electronically Signed   By: Marybelle Killings M.D.   On: 06/14/2016 11:41   Anti-infectives: Anti-infectives    Start     Dose/Rate Route Frequency Ordered Stop   06/20/16 2000  abacavir-dolutegravir-lamiVUDine (TRIUMEQ) [id number] MG per tablet 1 tablet     1 tablet Oral Daily 06/20/16 0821     06/20/16 1200  vancomycin (VANCOCIN) 500 mg in sodium chloride 0.9 % 100 mL IVPB     500 mg 100 mL/hr over 60 Minutes Intravenous Every 12 hours 06/20/16 1056  06/20/16 1130  piperacillin-tazobactam (ZOSYN) IVPB 3.375 g     3.375 g 12.5 mL/hr over 240 Minutes Intravenous Every 8 hours 06/20/16 1056     06/20/16 0700  cefUROXime (ZINACEF) 1.5 g in dextrose 5 % 50 mL IVPB     1.5 g 100 mL/hr over 30 Minutes Intravenous To Short Stay 06/19/16 0820 06/19/16 1237   06/19/16 2300  cefUROXime (ZINACEF) 1.5 g in dextrose 5 % 50 mL IVPB  Status:  Discontinued     1.5 g 100 mL/hr over 30 Minutes Intravenous Every 12 hours 06/19/16 1850 06/20/16 1047   06/19/16 1000  abacavir-dolutegravir-lamiVUDine (TRIUMEQ) [id number] MG per tablet 1 tablet  Status:  Discontinued     1 tablet Oral Daily 06/18/16 1855 06/20/16 0821      Assessment/Plan: s/p Procedure(s): LEFT  AXILLO-BIFEMORAL BYPASS GRAFT (Bilateral) Able from vascular standpoint. Okay to transfer to 2 W. if okay with hospitalist service. Continue to mobilize.   LOS: 3 days   Curt Jews 06/21/2016, 7:48 AM

## 2016-06-22 LAB — BASIC METABOLIC PANEL
ANION GAP: 4 — AB (ref 5–15)
BUN: 6 mg/dL (ref 6–20)
CO2: 22 mmol/L (ref 22–32)
Calcium: 8.1 mg/dL — ABNORMAL LOW (ref 8.9–10.3)
Chloride: 109 mmol/L (ref 101–111)
Creatinine, Ser: 1.08 mg/dL — ABNORMAL HIGH (ref 0.44–1.00)
GFR, EST NON AFRICAN AMERICAN: 54 mL/min — AB (ref >60)
Glucose, Bld: 107 mg/dL — ABNORMAL HIGH (ref 65–99)
POTASSIUM: 3.9 mmol/L (ref 3.5–5.1)
SODIUM: 135 mmol/L (ref 135–145)

## 2016-06-22 LAB — CBC
HCT: 29.2 % — ABNORMAL LOW (ref 36.0–46.0)
Hemoglobin: 9.3 g/dL — ABNORMAL LOW (ref 12.0–15.0)
MCH: 33.6 pg (ref 26.0–34.0)
MCHC: 31.8 g/dL (ref 30.0–36.0)
MCV: 105.4 fL — ABNORMAL HIGH (ref 78.0–100.0)
PLATELETS: 116 10*3/uL — AB (ref 150–400)
RBC: 2.77 MIL/uL — AB (ref 3.87–5.11)
RDW: 14.6 % (ref 11.5–15.5)
WBC: 8.3 10*3/uL (ref 4.0–10.5)

## 2016-06-22 NOTE — Progress Notes (Signed)
PROGRESS NOTE    Sheri Peterson  [id number] DOB: [birth year] DOA: 06/18/2016 PCP: Pcp Not In System    Brief Narrative:  Patient is 60 year old female with HIV, CAD status post CABG in February, hypertension, depression, and peripheral arterial disease with occlusions and bilateral iliacs and recent unsuccessful attempt at angioplasty, now under consideration for bypass, presenting in transfer from [facility] for evaluation of left foot pain of 6 days' duration suspected secondary to critical ischemia. Patient reported years long history of claudication and has been under the care of vascular surgery. With worsening symptoms, she underwent lower extremity angiography on 06/13/2016, but unfortunately the bilateral iliacs had occlusions it cannot be recanalized.She reported stable claudication in the right leg, but notes the excruciating pain in the left foot developing over the past 6 days at rest Upon arrival to the [facility], patient is found to be afebrile, saturating well on room air, with blood pressure 109/56, and vitals otherwise stable. She had a CMP which is notable for serum potassium of 5.7, BUN of 33, and serum creatinine 1.30, slightly up from an apparent baseline of 1.2. CBC features a macrocytosis with MCV of 104, but with remaining indices within the normal limits. Vascular surgery was consulted by the ED physician at [facility] and transferred to [facility] for possible surgical intervention was advised.  Assessment & Plan   SeverePAD with left foot ischemia  -Had LE angiography on 06/13/16 with occlusion of b/l iliacs, unable to be recanalized  -Has been on Plavix and ASA 81 mg daily and was placed on heparin drip. Currently on lovenox -Vascular surgery consulted, patient underwent left axillary to femoral and femoral to femoral bypass on 7/24 -PT and OT recommended SNF, patient refusing, will stay with a friend for a few days and have home health work with  her  Fevers/sirs: -Patient was spiking fevers this morning, temp 101.210F, BP low 93/59, heart rate 91.  -blood cultures show no growth to date -Started on vancomycin and zosyn -Low grade fever last night, 100.10F  -UA negative for UTI on 7/24.  Hyperkalemia -Resolved, continue to hold ACEi  Mild acute on CKD stage III resolved -SCr 1.30 on admission, slightly up from apparent baseline of ~1.20 , 1.5 on 7/18 -Currently 1.08 -Holding lisinopril in setting of hyperkalemia, AKI and hypotension   Hypertension  -Stable -continue to hold lisinopril, placed on IV fluids    HIV  -Continue current management with Triumeq   GERD -Managed with Zantac at home -Continue H2-blocker with Pepcid while in hospital    Macrocytosis  -MCV 104 with normal H&H; MCV had been wnl in 2015  -B12 and folate normal  DVT Prophylaxis  Lovenox  Code Status: Full  Family Communication: None at bedside  Disposition Plan: Admitted. Continue to monitor as patient continues to have low grade fever  Consultants Vascular surgery  Procedures  Left axillary to femoral and femoral to femoral bypass with 8 mm Hemashield graft  Antibiotics   Anti-infectives    Start     Dose/Rate Route Frequency Ordered Stop   06/20/16 2000  abacavir-dolutegravir-lamiVUDine (TRIUMEQ) 600-50-300 MG per tablet 1 tablet     1 tablet Oral Daily 06/20/16 0821     06/20/16 1200  vancomycin (VANCOCIN) 500 mg in sodium chloride 0.9 % 100 mL IVPB     500 mg 100 mL/hr over 60 Minutes Intravenous Every 12 hours 06/20/16 1056     06/20/16 1130  piperacillin-tazobactam (ZOSYN) IVPB 3.375 g  3.375 g 12.5 mL/hr over 240 Minutes Intravenous Every 8 hours 06/20/16 1056     06/20/16 0700  cefUROXime (ZINACEF) 1.5 g in dextrose 5 % 50 mL IVPB     1.5 g 100 mL/hr over 30 Minutes Intravenous To Short Stay 06/19/16 0820 06/19/16 1237   06/19/16 2300  cefUROXime (ZINACEF) 1.5 g in dextrose 5 % 50 mL IVPB  Status:  Discontinued      1.5 g 100 mL/hr over 30 Minutes Intravenous Every 12 hours 06/19/16 1850 06/20/16 1047   06/19/16 1000  abacavir-dolutegravir-lamiVUDine (TRIUMEQ) [id number] MG per tablet 1 tablet  Status:  Discontinued     1 tablet Oral Daily 06/18/16 1855 06/20/16 0821      Subjective:   [name] seen and examined today.  Continues to complain of leg pain and numbness in her feet. Denies chest pain, shortness of breath, abdominal pain, nausea, vomiting, diarrhea, constipation, dizziness or headache.   Objective:   Vitals:   06/21/16 1424 06/21/16 1638 06/21/16 2005 06/22/16 0300  BP: (!) 97/59 115/61 124/63 (!) 125/44  Pulse: 87 83 94 91  Resp: 17  20 20   Temp: 99.5 F (37.5 C) 99 F (37.2 C) 100.1 F (37.8 C) 99.1 F (37.3 C)  TempSrc: Oral Oral Oral Oral  SpO2: 100% 93% 96% 96%  Weight:      Height:        Intake/Output Summary (Last 24 hours) at 06/22/16 1014 Last data filed at 06/22/16 0900  Gross per 24 hour  Intake          2591.67 ml  Output              250 ml  Net          2341.67 ml   Filed Weights   06/19/16 1842 06/20/16 0500 06/21/16 0500  Weight: 63 kg (138 lb 14.2 oz) 68 kg (149 lb 14.6 oz) 69.4 kg (153 lb)    Exam  General: Well developed, well nourished, NAD, appears stated age  HEENT: NCAT,  mucous membranes moist.   Cardiovascular: S1 S2 auscultated, no murmurs, RRR  Respiratory: Clear to auscultation bilaterally with equal chest rise  Abdomen: Soft, nontender, nondistended, + bowel sounds  Extremities: warm dry without cyanosis clubbing or edema. Groin incision.   Neuro: AAOx3, nonfocal  Psych: Normal affect and demeanor, pleasant    Data Reviewed: I have personally reviewed following labs and imaging studies  CBC:  Recent Labs Lab 06/18/16 2128 06/20/16 0502 06/21/16 0842 06/22/16 0221  WBC 7.7 9.1 10.9* 8.3  NEUTROABS 4.2  --   --   --   HGB 13.5 10.5* 10.3* 9.3*  HCT 42.1  39.4 33.6* 32.6* 29.2*  MCV 105.0* 106.3* 107.2*  105.4*  PLT 162 121* 108* [id number]*   Basic Metabolic Panel:  Recent Labs Lab 06/18/16 2128 06/20/16 0502 06/22/16 0221  NA 138 136 135  K 5.1 4.5 3.9  CL 111 109 109  CO2 21* 22 22  GLUCOSE 90 120* 107*  BUN 28* 13 6  CREATININE 1.23* 1.05* 1.08*  CALCIUM 8.6* 8.2* 8.1*   GFR: Estimated Creatinine Clearance: 53.2 mL/min (by C-G formula based on SCr of 1.08 mg/dL). Liver Function Tests:  Recent Labs Lab 06/18/16 2128  AST 16  ALT 13*  ALKPHOS 70  BILITOT 0.4  PROT 6.7  ALBUMIN 3.3*   No results for input(s): LIPASE, AMYLASE in the last 168 hours. No results for input(s): AMMONIA in the last 168 hours. Coagulation  Profile:  Recent Labs Lab 06/18/16 2128  INR 1.45   Cardiac Enzymes:  Recent Labs Lab 06/18/16 2128  CKTOTAL 43   BNP (last 3 results) No results for input(s): PROBNP in the last 8760 hours. HbA1C: No results for input(s): HGBA1C in the last 72 hours. CBG:  Recent Labs Lab 06/20/16 0817 06/21/16 0732 06/21/16 1143  GLUCAP 119* 101* 131*   Lipid Profile: No results for input(s): CHOL, HDL, LDLCALC, TRIG, CHOLHDL, LDLDIRECT in the last 72 hours. Thyroid Function Tests: No results for input(s): TSH, T4TOTAL, FREET4, T3FREE, THYROIDAB in the last 72 hours. Anemia Panel: No results for input(s): VITAMINB12, FOLATE, FERRITIN, TIBC, IRON, RETICCTPCT in the last 72 hours. Urine analysis:    Component Value Date/Time   COLORURINE YELLOW 06/18/2016 2103   APPEARANCEUR CLEAR 06/18/2016 2103   LABSPEC 1.019 06/18/2016 2103   PHURINE 5.0 06/18/2016 2103   GLUCOSEU NEGATIVE 06/18/2016 2103   HGBUR NEGATIVE 06/18/2016 2103   BILIRUBINUR NEGATIVE 06/18/2016 2103   KETONESUR NEGATIVE 06/18/2016 2103   PROTEINUR NEGATIVE 06/18/2016 2103   NITRITE NEGATIVE 06/18/2016 2103   LEUKOCYTESUR NEGATIVE 06/18/2016 2103   Sepsis Labs: @LABRCNTIP (procalcitonin:4,lacticidven:4)  ) Recent Results (from the past 240 hour(s))  MRSA PCR Screening     Status:  Abnormal   Collection Time: 06/18/16  7:11 PM  Result Value Ref Range Status   MRSA by PCR POSITIVE (A) NEGATIVE Final    Comment:        The GeneXpert MRSA Assay (FDA approved for NASAL specimens only), is one component of a comprehensive MRSA colonization surveillance program. It is not intended to diagnose MRSA infection nor to guide or monitor treatment for MRSA infections. RESULT CALLED TO, READ BACK BY AND VERIFIED WITH: [place],T RN 2132 06/18/16 MITCHELL,L   Culture, blood (routine x 2)     Status: None (Preliminary result)   Collection Time: 06/20/16 12:40 PM  Result Value Ref Range Status   Specimen Description BLOOD RIGHT HAND  Final   Special Requests IN PEDIATRIC BOTTLE  3ML  Final   Culture NO GROWTH 1 DAY  Final   Report Status PENDING  Incomplete  Culture, blood (routine x 2)     Status: None (Preliminary result)   Collection Time: 06/20/16 12:46 PM  Result Value Ref Range Status   Specimen Description BLOOD LEFT HAND  Final   Special Requests IN PEDIATRIC BOTTLE  3ML  Final   Culture NO GROWTH 1 DAY  Final   Report Status PENDING  Incomplete      Radiology Studies: No results found.   Scheduled Meds: . abacavir-dolutegravir-lamiVUDine  1 tablet Oral Q2000  . aspirin EC  81 mg Oral Daily  . Chlorhexidine Gluconate Cloth  6 each Topical Q0600  . cholecalciferol  5,000 Units Oral Daily  . clopidogrel  75 mg Oral Daily  . docusate sodium  100 mg Oral Daily  . doxepin  50 mg Oral QHS  . enoxaparin (LOVENOX) injection  30 mg Subcutaneous Q24H  . famotidine  20 mg Oral Daily  . FLUoxetine  20 mg Oral Daily  . gabapentin  600 mg Oral TID  . lidocaine  1 patch Transdermal Q24H  . mupirocin ointment  1 application Nasal BID  . piperacillin-tazobactam (ZOSYN)  IV  3.375 g Intravenous Q8H  . rosuvastatin  10 mg Oral Daily  . sodium chloride flush  3 mL Intravenous Q12H  . vancomycin  500 mg Intravenous Q12H   Continuous Infusions: . sodium chloride 100  mL/hr at 06/20/16 1127     LOS: 4 days   Time Spent in minutes  30 minutes  Eliyahu Bille D.O. on 06/22/2016 at 10:14 AM  Between 7am to 7pm - Pager - [phone]  After 7pm go to [email or web address] - password [email or web address]  And look for the night coverage person covering for me after hours  [facility] Office  [phone]

## 2016-06-22 NOTE — Discharge Summary (Signed)
Discharge Summary    Sheri Peterson [birth year] 60 y.o. female  [id number]  Admission Date: 06/13/2016  Discharge Date: 06/14/16  Physician: No att. providers found  Admission Diagnosis: PVD   HPI:   This is a 60 y.o. female who is referred for evaluation of bilateral claudication the patient states that she will fall down because her legs give out with minimal activity.  She is requiring a walker currently.  She does not have any rest pain or nonhealing wounds.  Her most recent ABIs are 0.44 on the right and 0.39 on the left.  CT angiogram shows a focal occlusion in the left iliac artery and a long segment occlusion of the right iliac artery.  The patient reports having undergone percutaneous intervention in the past in another state.  She appears to have occlusion of the distal left SFA as well as a short segment stenosis within the right SFA.  The patient suffers from HIV which is currently undetectable on anti-retroviral therapy.  She also has hepatitis C.  She takes a statin for hypercholesterolemia.  She is on multiple medications for blood pressure.  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  Hospital Course:  The patient was admitted to the hospital and taken to the operating room on 06/13/2016 and underwent:                       1.  Ultrasound-guided access, left femoral artery                       2.  Left lower extremity angiogram                       3.  Conscious sedation 27 minutes  The pt tolerated the procedure well and was transported to the PACU in good condition.   By POD 1, a CTA of the chest/abdomen/pelvis was obtained to determine next step whether it be axillary-bifemoral bypass grafting or aortobifemoral bypass grafting.   IMPRESSION: Chest:  There is no evidence of thoracic aortic dissection or intramural hematoma. There is atherosclerotic plaque involving the large  and descending aorta.  There is plaque in the innominate artery without significant narrowing. It is mildly ectatic at 1.7 cm.  Mild narrowing of the right subclavian artery at the thoracic inlet. Right axillary artery is patent  Left common carotid artery is patent. Left subclavian and axillary arteries are patent. The left vertebral artery does not opacify and may be chronically occluded.  Chronic elevation of left hemidiaphragm with chronic atelectasis in the posterior basal left lower lobe.  Abdomen:  There is no evidence of aortic aneurysm or dissection. The aorta is occluded just above the bifurcation. There are inflammatory changes involving the wall of the distal thoracic aorta and adjacent fat. Noninfectious and infectious inflammatory disorders are possibilities. Correlate clinically as for the need for blood cultures.  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  Bilateral common femoral arteries reconstitute.  Significant narrowing at the origin of the left superficial femoral artery.  Right renal artery stenosis.  Celiac artery stenosis.  She will need cardiac clearance with Dr. Agustin Cree.    The remainder of the hospital course consisted of increasing mobilization and increasing intake of solids without difficulty.  She is discharged home with plans to f/u with Dr. Trula Slade in a week.  CBC  Component Value Date/Time   WBC 8.3 06/22/2016 0221   RBC 2.77 (L) 06/22/2016 0221   HGB 9.3 (L) 06/22/2016 0221   HCT 29.2 (L) 06/22/2016 0221   HCT 39.4 06/18/2016 2128   PLT 116 (L) 06/22/2016 0221   MCV 105.4 (H) 06/22/2016 0221   MCH 33.6 06/22/2016 0221   MCHC 31.8 06/22/2016 0221   RDW 14.6 06/22/2016 0221   RDW 14.1 12/04/2013 1105   LYMPHSABS 2.8 06/18/2016 2128   LYMPHSABS 1.3 12/04/2013 1105   MONOABS 0.4 06/18/2016 2128   EOSABS 0.2 06/18/2016 2128   EOSABS 0.3 12/04/2013 1105   BASOSABS 0.0 06/18/2016 2128   BASOSABS 0.1  12/04/2013 1105    BMET    Component Value Date/Time   NA 135 06/22/2016 0221   NA 139 12/04/2013 1105   K 3.9 06/22/2016 0221   CL 109 06/22/2016 0221   CO2 22 06/22/2016 0221   GLUCOSE 107 (H) 06/22/2016 0221   BUN 6 06/22/2016 0221   BUN 16 12/04/2013 1105   CREATININE 1.08 (H) 06/22/2016 0221   CALCIUM 8.1 (L) 06/22/2016 0221   GFRNONAA 54 (L) 06/22/2016 0221   GFRAA >60 06/22/2016 0221      Discharge Instructions    Call MD for:  redness, tenderness, or signs of infection (pain, swelling, bleeding, redness, odor or green/yellow discharge around incision site)    Complete by:  As directed   Call MD for:  severe or increased pain, loss or decreased feeling  in affected limb(s)    Complete by:  As directed   Call MD for:  temperature >100.5    Complete by:  As directed   Driving Restrictions    Complete by:  As directed   No driving for 48 hours   Lifting restrictions    Complete by:  As directed   No lifting for 2 weeks   Resume previous diet    Complete by:  As directed   may wash over wound with mild soap and water    Complete by:  As directed   Shower daily with soap and water starting 06/14/16      Discharge Diagnosis:  PVD  Secondary Diagnosis: Patient Active Problem List   Diagnosis Date Noted  . HIV disease ([place]) 06/18/2016  . GERD (gastroesophageal reflux disease) 06/18/2016  . Ischemic pain of left foot 06/18/2016  . Hyperkalemia 06/18/2016  . CKD (chronic kidney disease), stage III 06/18/2016  . Macrocytosis without anemia 06/18/2016  . Ischemic foot 06/18/2016  . Preoperative testing   . PAD (peripheral artery disease) ([place]) 06/13/2016  . Current tobacco use 12/22/2015  . Chronic pain associated with significant psychosocial dysfunction 06/12/2013  . H/O drug abuse 06/12/2013  . Chronic LBP 06/12/2013   Past Medical History:  Diagnosis Date  . Anxiety   . Arthritis   . Asthma   . Chronic kidney disease    ckd  . COPD (chronic obstructive  pulmonary disease) ([place])   . Depression   . Emphysema of lung ([place])   . GERD (gastroesophageal reflux disease)   . Hepatitis    Hep C per H&P by Dr. Trula Slade  . HIV infection [facility])   . Hyperlipidemia   . Hypertension   . Myocardial infarction ([place])   . PAD (peripheral artery disease) ([place])   . Seizures ([place])   . Substance abuse        Medication List    STOP taking these medications   methylPREDNISolone 4 MG tablet Commonly  known as:  MEDROL     TAKE these medications   acetaminophen 325 MG tablet Commonly known as:  TYLENOL Take 650 mg by mouth every 6 (six) hours as needed for mild pain.   aspirin EC 81 MG tablet Take 81 mg by mouth daily.   clopidogrel 75 MG tablet Commonly known as:  PLAVIX Take 75 mg by mouth daily. Reported on 05/15/2016   doxepin 50 MG capsule Commonly known as:  SINEQUAN Take 50-100 mg by mouth at bedtime.   FLUoxetine 20 MG tablet Commonly known as:  PROZAC Take 20 mg by mouth daily.   gabapentin 600 MG tablet Commonly known as:  NEURONTIN Take 600 mg by mouth 3 (three) times daily.   lisinopril 40 MG tablet Commonly known as:  PRINIVIL,ZESTRIL Take 40 mg by mouth 2 (two) times daily. Reported on 05/15/2016   ranitidine 150 MG tablet Commonly known as:  ZANTAC Take 150 mg by mouth daily.   rosuvastatin 10 MG tablet Commonly known as:  CRESTOR Take 10 mg by mouth at bedtime.   TRIUMEQ 600-50-300 MG tablet Generic drug:  abacavir-dolutegravir-lamiVUDine Take 1 tablet by mouth at bedtime.   Vitamin D3 5000 units Tabs Take 5,000 Units by mouth daily.       Prescriptions given: none  Disposition: home  Patient's condition: is Good  Follow up: 1. Dr. Trula Slade in 2 weeks 2. Dr. Agustin Cree in 1 week   Leontine Locket, PA-C [facility] [phone] 06/22/2016  8:38 AM

## 2016-06-22 NOTE — Progress Notes (Addendum)
[facility] Progress Note  Subjective  - POD #3  Groins hurt. Left foot numbness improving.   Objective Vitals:   06/21/16 2005 06/22/16 0300  BP: 124/63 (!) 125/44  Pulse: 94 91  Resp: 20 20  Temp: 100.1 F (37.8 C) 99.1 F (37.3 C)    Intake/Output Summary (Last 24 hours) at 06/22/16 0749 Last data filed at 06/22/16 0037  Gross per 24 hour  Intake          2351.67 ml  Output              150 ml  Net          2201.67 ml   Left infraclavicular incision c/d/i. No hematoma.  Bilateral groin incisions clean. Some mild erythema around incisions.  Left foot toe cyanosis resolved.  Feet warm bilaterally.   Assessment/Planning: 60 y.o. female is s/p: left axillo-bifemoral bypass graft 3 Days Post-Op   Bypasses are patent.  Low grade fever last night 100.1. Blood cultures pending. On vanc and zosyn per primary.  Very deconditioned. PT and OT recommending SNF, but patient is refusing saying she will stay with a friend for a few days. She says that she also has home PT and OT.  Needs to mobilize.  Home vs SNF once increasing mobilization.   Alvia Grove 06/22/2016 7:49 AM --  Laboratory CBC    Component Value Date/Time   WBC 8.3 06/22/2016 0221   HGB 9.3 (L) 06/22/2016 0221   HCT 29.2 (L) 06/22/2016 0221   HCT 39.4 06/18/2016 2128   PLT 116 (L) 06/22/2016 0221    BMET    Component Value Date/Time   NA 135 06/22/2016 0221   NA 139 12/04/2013 1105   K 3.9 06/22/2016 0221   CL 109 06/22/2016 0221   CO2 22 06/22/2016 0221   GLUCOSE 107 (H) 06/22/2016 0221   BUN 6 06/22/2016 0221   BUN 16 12/04/2013 1105   CREATININE 1.08 (H) 06/22/2016 0221   CALCIUM 8.1 (L) 06/22/2016 0221   GFRNONAA 54 (L) 06/22/2016 0221   GFRAA >60 06/22/2016 0221    COAG Lab Results  Component Value Date   INR 1.45 06/18/2016   No results found for: PTT  Antibiotics Anti-infectives    Start     Dose/Rate Route Frequency Ordered Stop   06/20/16 2000   abacavir-dolutegravir-lamiVUDine (TRIUMEQ) 600-50-300 MG per tablet 1 tablet     1 tablet Oral Daily 06/20/16 0821     06/20/16 1200  vancomycin (VANCOCIN) 500 mg in sodium chloride 0.9 % 100 mL IVPB     500 mg 100 mL/hr over 60 Minutes Intravenous Every 12 hours 06/20/16 1056     06/20/16 1130  piperacillin-tazobactam (ZOSYN) IVPB 3.375 g     3.375 g 12.5 mL/hr over 240 Minutes Intravenous Every 8 hours 06/20/16 1056     06/20/16 0700  cefUROXime (ZINACEF) 1.5 g in dextrose 5 % 50 mL IVPB     1.5 g 100 mL/hr over 30 Minutes Intravenous To Short Stay 06/19/16 0820 06/19/16 1237   06/19/16 2300  cefUROXime (ZINACEF) 1.5 g in dextrose 5 % 50 mL IVPB  Status:  Discontinued     1.5 g 100 mL/hr over 30 Minutes Intravenous Every 12 hours 06/19/16 1850 06/20/16 1047   06/19/16 1000  abacavir-dolutegravir-lamiVUDine (TRIUMEQ) [id number] MG per tablet 1 tablet  Status:  Discontinued     1 tablet Oral Daily 06/18/16 1855 06/20/16 [id number]  Virgina Jock, PA-C [facility] Office: [phone] Pager: [phone] 06/22/2016 7:49 AM    I have examined the patient, reviewed and agree with above.  Curt Jews, MD 06/22/2016 8:24 AM

## 2016-06-23 LAB — BASIC METABOLIC PANEL
ANION GAP: 8 (ref 5–15)
BUN: 5 mg/dL — AB (ref 6–20)
CHLORIDE: 108 mmol/L (ref 101–111)
CO2: 20 mmol/L — ABNORMAL LOW (ref 22–32)
Calcium: 8.3 mg/dL — ABNORMAL LOW (ref 8.9–10.3)
Creatinine, Ser: 1.02 mg/dL — ABNORMAL HIGH (ref 0.44–1.00)
GFR calc Af Amer: >60 mL/min (ref >60)
GFR, EST NON AFRICAN AMERICAN: 58 mL/min — AB (ref >60)
Glucose, Bld: 102 mg/dL — ABNORMAL HIGH (ref 65–99)
POTASSIUM: 4 mmol/L (ref 3.5–5.1)
SODIUM: 136 mmol/L (ref 135–145)

## 2016-06-23 LAB — CBC
HCT: 30.8 % — ABNORMAL LOW (ref 36.0–46.0)
HEMOGLOBIN: 10.1 g/dL — AB (ref 12.0–15.0)
MCH: 33.9 pg (ref 26.0–34.0)
MCHC: 32.8 g/dL (ref 30.0–36.0)
MCV: 103.4 fL — AB (ref 78.0–100.0)
PLATELETS: 135 10*3/uL — AB (ref 150–400)
RBC: 2.98 MIL/uL — AB (ref 3.87–5.11)
RDW: 14.7 % (ref 11.5–15.5)
WBC: 10.1 10*3/uL (ref 4.0–10.5)

## 2016-06-23 LAB — TYPE AND SCREEN
ABO/RH(D): O NEG
ANTIBODY SCREEN: POSITIVE
DAT, IgG: NEGATIVE
UNIT DIVISION: 0
UNIT DIVISION: 0

## 2016-06-23 LAB — GLUCOSE, CAPILLARY: GLUCOSE-CAPILLARY: 114 mg/dL — AB (ref 65–99)

## 2016-06-23 MED ORDER — ENOXAPARIN SODIUM 40 MG/0.4ML ~~LOC~~ SOLN: 40.0000 mg | Freq: Every day | SUBCUTANEOUS | Status: DC

## 2016-06-23 NOTE — Progress Notes (Signed)
Physical Therapy Treatment Patient Details Name: Sheri Peterson MRN: [id number] DOB: March 10, 1956 Today's Date: 06/23/2016    History of Present Illness 60 y.o. female admitted on 06/18/2016 S/P left axillary to bifemoral bypass graft 7/24. Pt febrile post op, possible infection?.    PT Comments    Pt tolerated increased distance of 40' with ambulation today, distance limited by L groin pain and fatigue.   Follow Up Recommendations  SNF;Supervision/Assistance - 24 hour (Do not feel patient safe for d/c home alone)     Equipment Recommendations  3in1 (PT)    Recommendations for Other Services       Precautions / Restrictions Precautions Precautions: Fall Restrictions Weight Bearing Restrictions: No    Mobility  Bed Mobility Overal bed mobility: Needs Assistance Bed Mobility: Rolling;Sidelying to Sit Rolling: Min assist Sidelying to sit: Min assist       General bed mobility comments: NT- up in chair  Transfers Overall transfer level: Needs assistance Equipment used: Rolling walker (2 wheeled) Transfers: Sit to/from Stand Sit to Stand: Min assist Stand pivot transfers: Min assist       General transfer comment: min A to rise from recliner, VCs for hand placement  Ambulation/Gait Ambulation/Gait assistance: Min guard Ambulation Distance (Feet): 40 Feet Assistive device: Rolling walker (2 wheeled) Gait Pattern/deviations: Step-to pattern;Decreased step length - left;Decreased step length - right;Antalgic   Gait velocity interpretation: Below normal speed for age/gender General Gait Details: distance limited by L groin pain, no LOB   Stairs            Wheelchair Mobility    Modified Rankin (Stroke Patients Only)       Balance                                    Cognition Arousal/Alertness: Awake/alert Behavior During Therapy: WFL for tasks assessed/performed Overall Cognitive Status: Within Functional Limits for tasks assessed                       Exercises General Exercises - Lower Extremity Ankle Circles/Pumps: AROM;Both;10 reps;Seated    General Comments        Pertinent Vitals/Pain Pain Assessment: 0-10 Pain Score: 9  Pain Location: L groin Pain Descriptors / Indicators: Sore Pain Intervention(s): Limited activity within patient's tolerance;Monitored during session;Premedicated before session    Home Living                      Prior Function            PT Goals (current goals can now be found in the care plan section) Acute Rehab PT Goals Patient Stated Goal: to go home PT Goal Formulation: With patient Time For Goal Achievement: 07/04/16 Potential to Achieve Goals: Good Progress towards PT goals: Progressing toward goals    Frequency  Min 3X/week    PT Plan Current plan remains appropriate    Co-evaluation             End of Session Equipment Utilized During Treatment: Gait belt Activity Tolerance: Patient limited by fatigue;Patient limited by pain Patient left: with call bell/phone within reach;in chair     Time: [id number] PT Time Calculation (min) (ACUTE ONLY): 15 min  Charges:  $Gait Training: 8-22 mins                    G Codes:  Blondell Reveal Kistler 06/23/2016, 10:04 AM [phone]

## 2016-06-23 NOTE — Progress Notes (Signed)
PROGRESS NOTE    Sheri Peterson  [id number] DOB: 07-Mar-1956 DOA: 06/18/2016 PCP: Pcp Not In System    Brief Narrative:  Patient is 60 year old female with HIV, CAD status post CABG in February, hypertension, depression, and peripheral arterial disease with occlusions and bilateral iliacs and recent unsuccessful attempt at angioplasty, now under consideration for bypass, presenting in transfer from [facility] for evaluation of left foot pain of 6 days' duration suspected secondary to critical ischemia. Patient reported years long history of claudication and has been under the care of vascular surgery. With worsening symptoms, she underwent lower extremity angiography on 06/13/2016, but unfortunately the bilateral iliacs had occlusions it cannot be recanalized.She reported stable claudication in the right leg, but notes the excruciating pain in the left foot developing over the past 6 days at rest Upon arrival to the [facility], patient is found to be afebrile, saturating well on room air, with blood pressure 109/56, and vitals otherwise stable. She had a CMP which is notable for serum potassium of 5.7, BUN of 33, and serum creatinine 1.30, slightly up from an apparent baseline of 1.2. CBC features a macrocytosis with MCV of 104, but with remaining indices within the normal limits. Vascular surgery was consulted by the ED physician at [facility] and transferred to [facility] for possible surgical intervention was advised.  Assessment & Plan   SeverePAD with left foot ischemia  -Had LE angiography on 06/13/16 with occlusion of b/l iliacs, unable to be recanalized  -Has been on Plavix and ASA 81 mg daily and was placed on heparin drip. Currently on lovenox -Vascular surgery consulted, patient underwent left axillary to femoral and femoral to femoral bypass on 7/24 -PT and OT recommended SNF, patient refusing, will stay with a friend for a few days and have home health work with  her  Fevers/sirs -Patient was spiking fevers this morning, temp 101.36F, BP low 93/59, heart rate 91.  -blood cultures show no growth to date, no leukocytosis  -Started on vancomycin and zosyn, will discontinue and monitor closely -Afebrile for 24 hours -UA negative for UTI on 7/24.  Hyperkalemia -Resolved, continue to hold ACEi  Mild acute on CKD stage III resolved -SCr 1.30 on admission, slightly up from apparent baseline of ~1.20 , 1.5 on 7/18 -Currently 1.08 -Holding lisinopril in setting of hyperkalemia, AKI and hypotension   Hypertension  -Stable -continue to hold lisinopril, placed on IV fluids    HIV  -Continue current management with Triume[facility]   GERD -Managed with Zantac at home -Continue H2-blocker with Pepcid while in hospital    Macrocytosis  -MCV 104 with normal H&H; MCV had been wnl in 2015  -B12 and folate normal  DVT Prophylaxis  Lovenox  Code Status: Full  Family Communication: None at bedside  Disposition Plan: Admitted. Continue to monitor as patient continues to have low grade fever  Consultants Vascular surgery  Procedures  Left axillary to femoral and femoral to femoral bypass with 8 mm Hemashield graft  Antibiotics   Anti-infectives    Start     Dose/Rate Route Frequency Ordered Stop   06/20/16 2000  abacavir-dolutegravir-lamiVUDine (TRIUMEQ) [id number] MG per tablet 1 tablet     1 tablet Oral Daily 06/20/16 0821     06/20/16 1200  vancomycin (VANCOCIN) 500 mg in sodium chloride 0.9 % 100 mL IVPB  Status:  Discontinued     500 mg 100 mL/hr over 60 Minutes Intravenous Every 12 hours 06/20/16 1056 06/23/16 1033   06/20/16  1130  piperacillin-tazobactam (ZOSYN) IVPB 3.375 g  Status:  Discontinued     3.375 g 12.5 mL/hr over 240 Minutes Intravenous Every 8 hours 06/20/16 1056 06/23/16 1033   06/20/16 0700  cefUROXime (ZINACEF) 1.5 g in dextrose 5 % 50 mL IVPB     1.5 g 100 mL/hr over 30 Minutes Intravenous To Short Stay 06/19/16 0820  06/19/16 1237   06/19/16 2300  cefUROXime (ZINACEF) 1.5 g in dextrose 5 % 50 mL IVPB  Status:  Discontinued     1.5 g 100 mL/hr over 30 Minutes Intravenous Every 12 hours 06/19/16 1850 06/20/16 1047   06/19/16 1000  abacavir-dolutegravir-lamiVUDine (TRIUMEQ) [id number] MG per tablet 1 tablet  Status:  Discontinued     1 tablet Oral Daily 06/18/16 1855 06/20/16 0821      Subjective:   Sheri Peterson seen and examined today.  Continues to complain of leg pain, left foot bruising and swelling, right inner thigh bruising. Denies chest pain, shortness of breath, abdominal pain, nausea, vomiting, diarrhea, constipation, dizziness or headache. Feels weak.  Wants to stay in the hospital until Monday.  Objective:   Vitals:   06/22/16 0300 06/22/16 1345 06/22/16 2100 06/23/16 0300  BP: (!) 125/44 (!) 122/50 116/90 (!) 135/49  Pulse: 91 89 92 86  Resp: 20 18 18 20   Temp: 99.1 F (37.3 C) 98.8 F (37.1 C) 99.1 F (37.3 C) 98.4 F (36.9 C)  TempSrc: Oral Oral Oral Oral  SpO2: 96% 95% 97% 99%  Weight:    71.5 kg (157 lb 10.1 oz)  Height:        Intake/Output Summary (Last 24 hours) at 06/23/16 1118 Last data filed at 06/22/16 1800  Gross per 24 hour  Intake              480 ml  Output              200 ml  Net              280 ml   Filed Weights   06/20/16 0500 06/21/16 0500 06/23/16 0300  Weight: 68 kg (149 lb 14.6 oz) 69.4 kg (153 lb) 71.5 kg (157 lb 10.1 oz)    Exam  General: Well developed, well nourished, NAD, appears stated age  HEENT: NCAT,  mucous membranes moist.   Cardiovascular: S1 S2 auscultated, no murmurs, RRR  Respiratory: Clear to auscultation bilaterally with equal chest rise  Abdomen: Soft, nontender, nondistended, + bowel sounds  Extremities: warm dry without cyanosis clubbing or edema. Groin incision. Left foot bruising with minor edema  Neuro: AAOx3, nonfocal  Psych: Normal affect and demeanor, pleasant    Data Reviewed: I have personally reviewed  following labs and imaging studies  CBC:  Recent Labs Lab 06/18/16 2128 06/20/16 0502 06/21/16 0842 06/22/16 0221 06/23/16 0256  WBC 7.7 9.1 10.9* 8.3 10.1  NEUTROABS 4.2  --   --   --   --   HGB 13.5 10.5* 10.3* 9.3* 10.1*  HCT 42.1  39.4 33.6* 32.6* 29.2* 30.8*  MCV 105.0* 106.3* 107.2* 105.4* 103.4*  PLT 162 121* 108* 116* [id number]*   Basic Metabolic Panel:  Recent Labs Lab 06/18/16 2128 06/20/16 0502 06/22/16 0221 06/23/16 0256  NA 138 136 135 136  K 5.1 4.5 3.9 4.0  CL 111 109 109 108  CO2 21* 22 22 20*  GLUCOSE 90 120* 107* 102*  BUN 28* 13 6 5*  CREATININE 1.23* 1.05* 1.08* 1.02*  CALCIUM 8.6* 8.2* 8.1* 8.3*  GFR: Estimated Creatinine Clearance: 56.3 mL/min (by C-G formula based on SCr of 1.02 mg/dL). Liver Function Tests:  Recent Labs Lab 06/18/16 2128  AST 16  ALT 13*  ALKPHOS 70  BILITOT 0.4  PROT 6.7  ALBUMIN 3.3*   No results for input(s): LIPASE, AMYLASE in the last 168 hours. No results for input(s): AMMONIA in the last 168 hours. Coagulation Profile:  Recent Labs Lab 06/18/16 2128  INR 1.45   Cardiac Enzymes:  Recent Labs Lab 06/18/16 2128  CKTOTAL 43   BNP (last 3 results) No results for input(s): PROBNP in the last 8760 hours. HbA1C: No results for input(s): HGBA1C in the last 72 hours. CBG:  Recent Labs Lab 06/20/16 0817 06/21/16 0732 06/21/16 1143 06/23/16 0648  GLUCAP 119* 101* 131* 114*   Lipid Profile: No results for input(s): CHOL, HDL, LDLCALC, TRIG, CHOLHDL, LDLDIRECT in the last 72 hours. Thyroid Function Tests: No results for input(s): TSH, T4TOTAL, FREET4, T3FREE, THYROIDAB in the last 72 hours. Anemia Panel: No results for input(s): VITAMINB12, FOLATE, FERRITIN, TIBC, IRON, RETICCTPCT in the last 72 hours. Urine analysis:    Component Value Date/Time   COLORURINE YELLOW 06/18/2016 2103   APPEARANCEUR CLEAR 06/18/2016 2103   LABSPEC 1.019 06/18/2016 2103   PHURINE 5.0 06/18/2016 2103   GLUCOSEU  NEGATIVE 06/18/2016 2103   HGBUR NEGATIVE 06/18/2016 2103   BILIRUBINUR NEGATIVE 06/18/2016 2103   KETONESUR NEGATIVE 06/18/2016 2103   PROTEINUR NEGATIVE 06/18/2016 2103   NITRITE NEGATIVE 06/18/2016 2103   LEUKOCYTESUR NEGATIVE 06/18/2016 2103   Sepsis Labs: @LABRCNTIP (procalcitonin:4,lacticidven:4)  ) Recent Results (from the past 240 hour(s))  MRSA PCR Screening     Status: Abnormal   Collection Time: 06/18/16  7:11 PM  Result Value Ref Range Status   MRSA by PCR POSITIVE (A) NEGATIVE Final    Comment:        The GeneXpert MRSA Assay (FDA approved for NASAL specimens only), is one component of a comprehensive MRSA colonization surveillance program. It is not intended to diagnose MRSA infection nor to guide or monitor treatment for MRSA infections. RESULT CALLED TO, READ BACK BY AND VERIFIED WITH: [place],T RN 2132 06/18/16 MITCHELL,L   Culture, blood (routine x 2)     Status: None (Preliminary result)   Collection Time: 06/20/16 12:40 PM  Result Value Ref Range Status   Specimen Description BLOOD RIGHT HAND  Final   Special Requests IN PEDIATRIC BOTTLE  3ML  Final   Culture NO GROWTH 2 DAYS  Final   Report Status PENDING  Incomplete  Culture, blood (routine x 2)     Status: None (Preliminary result)   Collection Time: 06/20/16 12:46 PM  Result Value Ref Range Status   Specimen Description BLOOD LEFT HAND  Final   Special Requests IN PEDIATRIC BOTTLE  3ML  Final   Culture NO GROWTH 2 DAYS  Final   Report Status PENDING  Incomplete      Radiology Studies: No results found.   Scheduled Meds: . abacavir-dolutegravir-lamiVUDine  1 tablet Oral Q2000  . aspirin EC  81 mg Oral Daily  . Chlorhexidine Gluconate Cloth  6 each Topical Q0600  . cholecalciferol  5,000 Units Oral Daily  . clopidogrel  75 mg Oral Daily  . docusate sodium  100 mg Oral Daily  . doxepin  50 mg Oral QHS  . [START ON 06/24/2016] enoxaparin (LOVENOX) injection  40 mg Subcutaneous Daily  .  famotidine  20 mg Oral Daily  . FLUoxetine  20 mg  Oral Daily  . gabapentin  600 mg Oral TID  . lidocaine  1 patch Transdermal Q24H  . mupirocin ointment  1 application Nasal BID  . rosuvastatin  10 mg Oral Daily  . sodium chloride flush  3 mL Intravenous Q12H   Continuous Infusions: . sodium chloride 100 mL/hr at 06/23/16 0942     LOS: 5 days   Time Spent in minutes  30 minutes  Allison Silva D.O. on 06/23/2016 at 11:18 AM  Between 7am to 7pm - Pager - [phone]  After 7pm go to [email or web address] - password [email or web address]  And look for the night coverage person covering for me after hours  [facility] Office  [phone]

## 2016-06-23 NOTE — Progress Notes (Addendum)
[facility] Progress Note  Subjective  - POD #4  Complaining of bilateral groin pain, left foot swelling and bruise to right inner thigh.   Objective Vitals:   06/22/16 2100 06/23/16 0300  BP: 116/90 (!) 135/49  Pulse: 92 86  Resp: 18 20  Temp: 99.1 F (37.3 C) 98.4 F (36.9 C)    Intake/Output Summary (Last 24 hours) at 06/23/16 0954 Last data filed at 06/22/16 1800  Gross per 24 hour  Intake              480 ml  Output              200 ml  Net              280 ml   Sitting up in chair, much more alert today, in NAD Left chest incision c/d/i Groin incisions c/d/i.  Small bruise to right inner thigh without hematoma Palpable fem-fem graft pulse.  Doppler flow left DP, PT  Assessment/Planning: 60 y.o. female is s/p: left axillo-bifemoral bypass 4 Days Post-Op   Stable from vascular standpoint.  Ambulated some in halls today, overall still weak. Still refusing SNF. Wants to stay in hospital until Monday whenever friend comes home and patient is able to stay with her.  Afebrile overnight. Blood cultures negative to date. Incisions are clean.  Keep left leg elevated to reduce swelling.   Alvia Grove 06/23/2016 9:54 AM -- Agree with above.  Going home with brother tomorrow.  Can follow up with Dr Donnetta Hutching in 2 weeks  Ruta Hinds, MD [facility] [place] Office: [phone] Pager: [phone]  Laboratory CBC    Component Value Date/Time   WBC 10.1 06/23/2016 0256   HGB 10.1 (L) 06/23/2016 0256   HCT 30.8 (L) 06/23/2016 0256   HCT 39.4 06/18/2016 2128   PLT 135 (L) 06/23/2016 0256    BMET    Component Value Date/Time   NA 136 06/23/2016 0256   NA 139 12/04/2013 1105   K 4.0 06/23/2016 0256   CL 108 06/23/2016 0256   CO2 20 (L) 06/23/2016 0256   GLUCOSE 102 (H) 06/23/2016 0256   BUN 5 (L) 06/23/2016 0256   BUN 16 12/04/2013 1105   CREATININE 1.02 (H) 06/23/2016 0256   CALCIUM 8.3 (L) 06/23/2016 0256   GFRNONAA 58 (L) 06/23/2016 0256   GFRAA >60 06/23/2016 0256    COAG Lab Results  Component Value Date   INR 1.45 06/18/2016   No results found for: PTT  Antibiotics Anti-infectives    Start     Dose/Rate Route Frequency Ordered Stop   06/20/16 2000  abacavir-dolutegravir-lamiVUDine (TRIUMEQ) [id number] MG per tablet 1 tablet     1 tablet Oral Daily 06/20/16 0821     06/20/16 1200  vancomycin (VANCOCIN) 500 mg in sodium chloride 0.9 % 100 mL IVPB     500 mg 100 mL/hr over 60 Minutes Intravenous Every 12 hours 06/20/16 1056     06/20/16 1130  piperacillin-tazobactam (ZOSYN) IVPB 3.375 g     3.375 g 12.5 mL/hr over 240 Minutes Intravenous Every 8 hours 06/20/16 1056     06/20/16 0700  cefUROXime (ZINACEF) 1.5 g in dextrose 5 % 50 mL IVPB     1.5 g 100 mL/hr over 30 Minutes Intravenous To Short Stay 06/19/16 0820 06/19/16 1237   06/19/16 2300  cefUROXime (ZINACEF) 1.5 g in dextrose 5 % 50 mL IVPB  Status:  Discontinued     1.5  g 100 mL/hr over 30 Minutes Intravenous Every 12 hours 06/19/16 1850 06/20/16 1047   06/19/16 1000  abacavir-dolutegravir-lamiVUDine (TRIUMEQ) [id number] MG per tablet 1 tablet  Status:  Discontinued     1 tablet Oral Daily 06/18/16 1855 06/20/16 [place], PA-C [facility] Office: [phone] Pager: [phone] 06/23/2016 9:54 AM

## 2016-06-23 NOTE — Progress Notes (Signed)
Occupational Therapy Treatment Patient Details Name: Sheri Peterson MRN: [id number] DOB: October 23, 1956 Today's Date: 06/23/2016    History of present illness 60 y.o. female admitted on 06/18/2016 S/P left axillary to bifemoral bypass graft 7/24. Pt febrile post op, possible infection?.   OT comments  Pt. Reluctant for participation but agreed to participation in skilled OT.  Able to complete stand pivot transfer min a.  Requires max instructional cues for safety, but is non complaint with.  Will continue to follow acutely.  Agree with initial recommendation for snf/24Hr  Follow Up Recommendations  SNF;Supervision/Assistance - 24 hour    Equipment Recommendations       Recommendations for Other Services      Precautions / Restrictions Precautions Precautions: Fall       Mobility Bed Mobility Overal bed mobility: Needs Assistance Bed Mobility: Rolling;Sidelying to Sit Rolling: Min assist Sidelying to sit: Min assist       General bed mobility comments: "im in no condition to do this alone".  provided instructions for how therapist asst. would help her.  pt. continued to interrupt and complain about each thing i was attempting and explaining.    Transfers Overall transfer level: Needs assistance Equipment used: None Transfers: Sit to/from [facility] Sit to Stand: Min assist Stand pivot transfers: Min assist       General transfer comment: min a to transition from sit/stand, cues for hand placement and controlled sitting. pt. not listening.    Balance                                   ADL   Eating/Feeding: Set up;Sitting                       Toilet Transfer: Minimal assistance;Stand-pivot Toilet Transfer Details (indicate cue type and reason): simulated with stand pivot eob pivotal steps to recliner.  max cues for hand placement pt. not listening to instructions Toileting- Clothing Manipulation and Hygiene: Minimal  assistance;Sit to/from stand       Functional mobility during ADLs: Minimal assistance General ADL Comments: pt. requires cues for safety and sequencing but is non complaint or receptive to instruction.        Vision                     Perception     Praxis      Cognition   Behavior During Therapy: Anxious                         Extremity/Trunk Assessment               Exercises     Shoulder Instructions       General Comments      Pertinent Vitals/ Pain       Pain Assessment: 0-10 Pain Score: 9  Pain Location: LUE Pain Descriptors / Indicators: Aching Pain Intervention(s): Limited activity within patient's tolerance;Monitored during session  Home Living                                          Prior Functioning/Environment              Frequency Min 2X/week     Progress Toward Goals  OT Goals(current  goals can now be found in the care plan section)  Progress towards OT goals: Progressing toward goals     Plan Discharge plan remains appropriate    Co-evaluation                 End of Session Equipment Utilized During Treatment: Gait belt   Activity Tolerance Patient tolerated treatment well   Patient Left in chair;with call bell/phone within reach   Nurse Communication Other (comment) (notified CNA pt. was in recliner eating breakfast)        Time: [id number] OT Time Calculation (min): 13 min  Charges: OT General Charges $OT Visit: 1 Procedure OT Treatments $Self Care/Home Management : 8-22 mins  Janice Coffin, COTA/L 06/23/2016, 8:59 AM

## 2016-06-24 LAB — CBC
HEMATOCRIT: 27.2 % — AB (ref 36.0–46.0)
Hemoglobin: 8.8 g/dL — ABNORMAL LOW (ref 12.0–15.0)
MCH: 33.5 pg (ref 26.0–34.0)
MCHC: 32.4 g/dL (ref 30.0–36.0)
MCV: 103.4 fL — AB (ref 78.0–100.0)
PLATELETS: 145 10*3/uL — AB (ref 150–400)
RBC: 2.63 MIL/uL — AB (ref 3.87–5.11)
RDW: 14.3 % (ref 11.5–15.5)
WBC: 7.3 10*3/uL (ref 4.0–10.5)

## 2016-06-24 LAB — GLUCOSE, CAPILLARY: Glucose-Capillary: 116 mg/dL — ABNORMAL HIGH (ref 65–99)

## 2016-06-24 MED ORDER — MUPIROCIN 2 % EX OINT: 1.0000 "application " | TOPICAL_OINTMENT | Freq: Two times a day (BID) | CUTANEOUS | 0 refills | Status: DC

## 2016-06-24 MED ORDER — DOCUSATE SODIUM 100 MG PO CAPS: 100.0000 mg | ORAL_CAPSULE | Freq: Every day | ORAL | 0 refills | Status: DC

## 2016-06-24 MED ORDER — BISACODYL 5 MG PO TBEC: 5.0000 mg | DELAYED_RELEASE_TABLET | Freq: Every day | ORAL | 0 refills | Status: AC | PRN

## 2016-06-24 MED ORDER — CHLORHEXIDINE GLUCONATE CLOTH 2 % EX PADS: 6.0000 | MEDICATED_PAD | Freq: Every day | CUTANEOUS | 0 refills | Status: AC

## 2016-06-24 MED ORDER — POLYETHYLENE GLYCOL 3350 17 G PO PACK: 17.0000 g | PACK | Freq: Every day | ORAL | 0 refills | Status: DC | PRN

## 2016-06-24 MED ORDER — ONDANSETRON HCL 4 MG PO TABS: 4.0000 mg | ORAL_TABLET | Freq: Four times a day (QID) | ORAL | 0 refills | Status: DC | PRN

## 2016-06-24 NOTE — Progress Notes (Signed)
Patient discharged home with her brother who she stated would help care for her. Both IV's were dc'd and were intact. Discharge papers were educated on and she stated that she understood.

## 2016-06-24 NOTE — Progress Notes (Signed)
[facility] [place]  Subjective  - wants to go home   Objective 119/73 76 98 F (36.7 C) (Oral) 18 98%  Intake/Output Summary (Last 24 hours) at 06/24/16 0758 Last data filed at 06/23/16 1700  Gross per 24 hour  Intake              600 ml  Output                0 ml  Net              600 ml   + graft pulse Incisions clean  Assessment/Planning: Ok for d/c from our standpoint F/u Dr Donnetta Hutching in 2 weeks  Ruta Hinds 06/24/2016 7:58 AM --  Laboratory Lab Results:  Recent Labs  06/23/16 0256 06/24/16 0343  WBC 10.1 7.3  HGB 10.1* 8.8*  HCT 30.8* 27.2*  PLT 135* 145*   BMET  Recent Labs  06/22/16 0221 06/23/16 0256  NA 135 136  K 3.9 4.0  CL 109 108  CO2 22 20*  GLUCOSE 107* 102*  BUN 6 5*  CREATININE 1.08* 1.02*  CALCIUM 8.1* 8.3*    COAG Lab Results  Component Value Date   INR 1.45 06/18/2016   No results found for: PTT

## 2016-06-24 NOTE — Discharge Summary (Signed)
Physician Discharge Summary  Sheri Peterson [id number] DOB: May 31, 1956 DOA: 06/18/2016  PCP: Pcp Not In System  Admit date: 06/18/2016 Discharge date: 06/24/2016  Time spent: 45 minutes  Recommendations for Outpatient Follow-up:  Patient will be discharged to home, outpatient therapy.  Patient will need to follow up with primary care provider within one week of discharge, repeat CBC and BMP (labs). Follow up with Dr. Donnetta Hutching in 2 weeks.  Patient should continue medications as prescribed.  Patient should follow a heart healthy diet. Advised to stop smoking.  Discharge Diagnoses:  Principal Problem:   Ischemic pain of left foot Active Problems:   Current tobacco use   Chronic LBP   PAD (peripheral artery disease) (HCC)   HIV disease (HCC)   GERD (gastroesophageal reflux disease)   Hyperkalemia   CKD (chronic kidney disease), stage III   Macrocytosis/ anemia   Ischemic foot   Preoperative testing   Discharge Condition: Stable  Diet recommendation: Heart healthy  Filed Weights   06/21/16 0500 06/23/16 0300 06/24/16 0506  Weight: 69.4 kg (153 lb) 71.5 kg (157 lb 10.1 oz) 74.4 kg (164 lb 0.4 oz)    History of present illness:  On 06/18/2016 by Dr. Christia Reading Opyd Sheri Peterson is a 60 y.o. female with medical history significant for HIV, CAD status post CABG in February, hypertension, depression, and peripheral arterial disease with occlusions and bilateral iliacs and recent unsuccessful attempt at angioplasty, now under consideration for bypass, presenting in transfer from [facility] for evaluation of left foot pain of 6 days' duration suspected secondary to critical ischemia. Patient reports years long history of claudication and has been under the care of vascular surgery. With worsening symptoms, she underwent lower extremity angiography on 06/13/2016, but unfortunately the bilateral iliacs had occlusions it cannot be recanalized. Patient denies any fevers, chills, chest  pains, or palpitations. She endorses stable claudication in the right leg, but notes the excruciating pain in the left foot developing over the past 6 days at rest. She reports that pain in the leg dependently does not provide any relief, but her symptoms are tolerable as long as there is no contact with the left great toe. She reports inability to wear shoes or socks at this time due to the exquisite tenderness involving the left great toe. She reports that over this interval, the left toe has taken on a purplish hue. She denies any trauma to the site, denies wound, and reports only minimal swelling around the left forefoot. She denies any chest pains or palpitations, dyspnea or cough, abdominal pain, nausea, vomiting, diarrhea, dysuria, or suprapubic tenderness.  Hospital Course:  SeverePAD with left foot ischemia  -Had LE angiography on 06/13/16 with occlusion of b/l iliacs, unable to be recanalized  -Has been on Plavix and ASA 81 mg daily and was placed on heparin drip. Currently on lovenox -Vascular surgery consulted, patient underwent left axillary to femoral and femoral to femoral bypass on 7/24 -PT and OT recommended SNF, patient refusing, will stay with a friend for a few days and have home health work with her -Follow up Dr. Donnetta Hutching in 2 weeks  Fevers/sirs -Patient was spiking fevers this morning, temp 101.73F, BP low 93/59, heart rate 91.  -blood cultures show no growth to date, no leukocytosis  -Started on vancomycin and zosyn, discontinued antibiotics, no further fevers or leukocytosis. -Afebrile for 48 hours -UA negative for UTI on 7/24.  Hyperkalemia -Resolved, continue to hold ACEi  Mild acute on CKD stage III  -  resolved -SCr 1.30 on admission, slightly up from apparent baseline of ~1.20 , 1.5 on 7/18 -Currently 1.08 -Holding lisinopril in setting of hyperkalemia, AKI and hypotension -Repeat BMP in one week  Hypertension  -Stable -continue to hold lisinopril, placed  on IV fluids   HIV  -Continue current management with Triumeq   GERD -Managed with Zantac at home -Continue H2-blocker with Pepcid while in hospital   Macrocytic Anemia -MCV 104 with normal H&H; MCV had been wnl in 2015  -B12 and folate normal -Hemoglobin currently 8.8 (possibly secondary to bruising vs IVF) -Repeat CBC in one week.  Tobacco abuse -smoking cessation discussed, patient has no intentions of quitting.  Consultants Vascular surgery  Procedures  Left axillary to femoral and femoral to femoral bypass with 8 mm Hemashield graft  Discharge Exam: Vitals:   06/23/16 2015 06/24/16 0506  BP: 128/73 119/73  Pulse: 85 76  Resp: 18 18  Temp: 99.3 F (37.4 C) 98 F (36.7 C)   Exam  General: Well developed, well nourished, NAD, appears stated age  HEENT: NCAT,  mucous membranes moist.   Cardiovascular: S1 S2 auscultated, no murmurs, RRR  Respiratory: Clear to auscultation bilaterally with equal chest rise  Abdomen: Soft, nontender, nondistended, + bowel sounds  Extremities: warm dry without cyanosis clubbing or edema. Groin incision. Left foot bruising with minor edema-improving.  Neuro: AAOx3, nonfocal  Psych: Normal affect and demeanor, pleasant   Discharge Instructions  Discharge Instructions    Discharge instructions    Complete by:  As directed   Patient will be discharged to home, outpatient therapy.  Patient will need to follow up with primary care provider within one week of discharge, repeat CBC and BMP (labs). Follow up with Dr. Donnetta Hutching in 2 weeks.  Patient should continue medications as prescribed.  Patient should follow a heart healthy diet. Advised to stop smoking.  You were cared for by a hospitalist during your hospital stay. If you have any questions about your discharge medications or the care you received while you were in the hospital after you are discharged, you can call the unit and asked to speak with the hospitalist on call if the  hospitalist that took care of you is not available. Once you are discharged, your primary care physician will handle any further medical issues. Please note that NO REFILLS for any discharge medications will be authorized once you are discharged, as it is imperative that you return to your primary care physician (or establish a relationship with a primary care physician if you do not have one) for your aftercare needs so that they can reassess your need for medications and monitor your lab values.       Medication List    STOP taking these medications   lisinopril 40 MG tablet Commonly known as:  PRINIVIL,ZESTRIL     TAKE these medications   acetaminophen 325 MG tablet Commonly known as:  TYLENOL Take 650 mg by mouth every 6 (six) hours as needed for mild pain.   aspirin EC 81 MG tablet Take 81 mg by mouth daily.   bisacodyl 5 MG EC tablet Commonly known as:  DULCOLAX Take 1 tablet (5 mg total) by mouth daily as needed for moderate constipation.   Chlorhexidine Gluconate Cloth 2 % Pads Apply 6 each topically daily at 6 (six) AM.   clopidogrel 75 MG tablet Commonly known as:  PLAVIX Take 75 mg by mouth daily. Reported on 05/15/2016   docusate sodium 100 MG capsule Commonly  known as:  COLACE Take 1 capsule (100 mg total) by mouth daily.   doxepin 50 MG capsule Commonly known as:  SINEQUAN Take 50-100 mg by mouth at bedtime.   FLUoxetine 20 MG tablet Commonly known as:  PROZAC Take 20 mg by mouth daily.   gabapentin 600 MG tablet Commonly known as:  NEURONTIN Take 600 mg by mouth 3 (three) times daily.   lidocaine 5 % Commonly known as:  LIDODERM Place 1 patch onto the skin daily. Remove & Discard patch within 12 hours or as directed by MD   mupirocin ointment 2 % Commonly known as:  BACTROBAN Place 1 application into the nose 2 (two) times daily.   MUSCLE RUB 10-15 % Crea Apply 1 application topically 2 (two) times daily as needed for muscle pain.   ondansetron 4  MG tablet Commonly known as:  ZOFRAN Take 1 tablet (4 mg total) by mouth every 6 (six) hours as needed for nausea.   OVER THE COUNTER MEDICATION Place 1 drop into both eyes 3 (three) times daily as needed (dry eyes). Over the counter lubricant eye drops   oxyCODONE 5 MG immediate release tablet Commonly known as:  Oxy IR/ROXICODONE Take 5-10 mg by mouth every 6 (six) hours as needed for severe pain (pain).   polyethylene glycol packet Commonly known as:  MIRALAX / GLYCOLAX Take 17 g by mouth daily as needed for mild constipation.   PROAIR HFA 108 (90 Base) MCG/ACT inhaler Generic drug:  albuterol Inhale 2 puffs into the lungs every 6 (six) hours as needed for wheezing or shortness of breath.   ranitidine 150 MG tablet Commonly known as:  ZANTAC Take 150 mg by mouth daily.   rosuvastatin 10 MG tablet Commonly known as:  CRESTOR Take 10 mg by mouth at bedtime.   TRIUMEQ 600-50-300 MG tablet Generic drug:  abacavir-dolutegravir-lamiVUDine Take 1 tablet by mouth at bedtime.   Vitamin D3 5000 units Tabs Take 5,000 Units by mouth daily.      Allergies  Allergen Reactions  . Morphine And Related Itching and Rash   Follow-up Information    Early, Todd, MD Follow up in 2 week(s).   Specialties:  Vascular Surgery, Cardiology Why:  office will call with appoinment  Contact information: [address] [place] [address] [phone]        Primary care physician. Schedule an appointment as soon as possible for a visit in 1 week(s).   Why:  Hospital follow up.  Monitor labs.           The results of significant diagnostics from this hospitalization (including imaging, microbiology, ancillary and laboratory) are listed below for reference.    Significant Diagnostic Studies: Dg Chest Port 1 View  Result Date: 06/18/2016 CLINICAL DATA:  60 year old female -preoperative respiratory examination prior to extremity procedure/surgery. EXAM: PORTABLE CHEST 1 VIEW  COMPARISON:  01/25/2016 and prior radiographs FINDINGS: Cardiomediastinal silhouette is unchanged with evidence of previous CABG. Unchanged left retrocardiac opacity probably represents a combination of effusion and atelectasis/scarring versus consolidation. There is no evidence of pneumothorax. There is no evidence of right effusion. IMPRESSION: Left retrocardiac opacity again noted -likely representing combination of effusion, atelectasis/ scarring and/or consolidation. No new or acute abnormalities identified otherwise. Electronically Signed   By: Margarette Canada M.D.   On: 06/18/2016 19:02  Ct Angio Chest/abd/pel For Dissection W And/or W/wo  Result Date: 06/14/2016 CLINICAL DATA:  Peripheral vascular disease. Aortobifemoral versus axillo-bifemoral bypass graft. EXAM: CT ANGIOGRAPHY CHEST, ABDOMEN AND  PELVIS TECHNIQUE: Multidetector CT imaging through the chest, abdomen and pelvis was performed using the standard protocol during bolus administration of intravenous contrast. Multiplanar reconstructed images and MIPs were obtained and reviewed to evaluate the vascular anatomy. CONTRAST:  100 cc Isovue 370 COMPARISON:  01/28/2016 FINDINGS: CTA CHEST FINDINGS There is no evidence of aortic dissection or intramural hematoma. There is smooth bus circumferential soft plaque involving the distal aortic arch and descending thoracic aorta. Atherosclerotic calcifications in the arch are noted. Maximal diameter of the ascending aorta is 3.3 cm. The arch and descending aorta are non aneurysmal. The innominate artery is 1.7 cm in caliber. There is some chronic mural thrombus with calcification along the wall. It is patent. Right subclavian and common carotid arteries are patent. Right vertebral artery is patent. There is mild narrowing of the right subclavian artery at the thoracic outlet. Left common carotid artery via bovine origin is widely patent. There is some irregular plaque at the origin of the left subclavian artery  without significant focal narrowing. The left vertebral artery does not opacified. It is likely occluded. Left axillary artery is patent. No filling defect is present in the pulmonary arterial tree to suggest acute pulmonary thromboembolism. Postoperative changes from sternotomy are noted. Internal mammary bypass graft is suspected. There is no abnormal mediastinal adenopathy. Small mediastinal nodes are stable. No pericardial effusion. Normal thyroid gland. No pneumothorax or pleural effusion. There is chronic atelectasis in the posterior basal segment of the left lower lobe. Minimal dependent atelectasis at the right base. The left hemidiaphragm is elevated. No acute bony deformity. Advanced degenerative disc disease at C5-6 and C6-7 is present. No vertebral compression deformity in the thoracic spine. T12 hemangioma Review of the MIP images confirms the above findings. CTA ABDOMEN AND PELVIS FINDINGS Aorta is non aneurysmal and patent. There is no evidence of dissection or intramural hematoma. Smooth circumferential plaque along the aorta is noted. The distal aorta is occluded just above the bifurcation. Compared to the prior study, there is some stranding within the fat surrounding the infrarenal aorta. The wall is also somewhat hazy. These findings suggest an inflammatory process. Severe narrowing in the proximal celiac axis likely due to a combination of atherosclerosis in median arcuate ligament syndrome. Branch vessels are patent. SMA is patent. IMA is patent. There is critical narrowing of the right renal artery just beyond its takeoff. There is mild plaque and narrowing at the origin of the left renal artery. The bilateral common iliac and external iliac arteries are occluded. Tiny internal iliac artery branches reconstitute. Bilateral common femoral arteries reconstitute and are diminutive. No significant narrowing in the visualize right common femoral, superficial femoral, profunda femoral arteries.  There is mild diffuse narrowing of the left common femoral artery. The proximal left superficial femoral artery is severely narrowed. Left profunda femoral artery branches are patent. Liver is unremarkable There is sludge in the gallbladder. Spleen, pancreas are within normal limits Diffuse prominence of the adrenal glands without focal nodule. Kidneys are within normal limits. No focal mass in the colon. Normal appendix. No evidence of small-bowel obstruction. No free-fluid.  There is no abnormal retroperitoneal adenopathy. Bladder is distended. Uterus is diminutive. Adnexa are unremarkable. No vertebral compression deformity. There is degenerative disc disease with an lumbar spine. It is advanced at L4-5 with vacuum. Multilevel facet arthropathy is also present. Review of the MIP images confirms the above findings. IMPRESSION: Chest: There is no evidence of thoracic aortic dissection or intramural hematoma. There is atherosclerotic plaque involving  the large and descending aorta. There is plaque in the innominate artery without significant narrowing. It is mildly ectatic at 1.7 cm. Mild narrowing of the right subclavian artery at the thoracic inlet. Right axillary artery is patent Left common carotid artery is patent. Left subclavian and axillary arteries are patent. The left vertebral artery does not opacify and may be chronically occluded. Chronic elevation of left hemidiaphragm with chronic atelectasis in the posterior basal left lower lobe. Abdomen: There is no evidence of aortic aneurysm or dissection. The aorta is occluded just above the bifurcation. There are inflammatory changes involving the wall of the distal thoracic aorta and adjacent fat. Noninfectious and infectious inflammatory disorders are possibilities. Correlate clinically as for the need for blood cultures. 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 Bilateral common femoral arteries reconstitute. Significant narrowing at the origin  of the left superficial femoral artery. 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 Electronically Signed   By: Marybelle Killings M.D.   On: 06/14/2016 11:41    Microbiology: Recent Results (from the past 240 hour(s))  MRSA PCR Screening     Status: Abnormal   Collection Time: 06/18/16  7:11 PM  Result Value Ref Range Status   MRSA by PCR POSITIVE (A) NEGATIVE Final    Comment:        The GeneXpert MRSA Assay (FDA approved for NASAL specimens only), is one component of a comprehensive MRSA colonization surveillance program. It is not intended to diagnose MRSA infection nor to guide or monitor treatment for MRSA infections. RESULT CALLED TO, READ BACK BY AND VERIFIED WITH: TOURVILLE,T RN [id number] 06/18/16 MITCHELL,L   Culture, blood (routine x 2)     Status: None (Preliminary result)   Collection Time: 06/20/16 12:40 PM  Result Value Ref Range Status   Specimen Description BLOOD RIGHT HAND  Final   Special Requests IN PEDIATRIC BOTTLE  3ML  Final   Culture NO GROWTH 3 DAYS  Final   Report Status PENDING  Incomplete  Culture, blood (routine x 2)     Status: None (Preliminary result)   Collection Time: 06/20/16 12:46 PM  Result Value Ref Range Status   Specimen Description BLOOD LEFT HAND  Final   Special Requests IN PEDIATRIC BOTTLE  3ML  Final   Culture NO GROWTH 3 DAYS  Final   Report Status PENDING  Incomplete     Labs: Basic Metabolic Panel:  Recent Labs Lab 06/18/16 2128 06/20/16 0502 06/22/16 0221 06/23/16 0256  NA 138 136 135 136  K 5.1 4.5 3.9 4.0  CL 111 109 109 108  CO2 21* 22 22 20*  GLUCOSE 90 120* 107* 102*  BUN 28* 13 6 5*  CREATININE 1.23* 1.05* 1.08* 1.02*  CALCIUM 8.6* 8.2* 8.1* 8.3*   Liver Function Tests:  Recent Labs Lab 06/18/16 2128  AST 16  ALT 13*  ALKPHOS 70  BILITOT 0.4  PROT 6.7  ALBUMIN 3.3*   No results for input(s): LIPASE, AMYLASE in the last 168 hours. No results for input(s): AMMONIA in the last 168  hours. CBC:  Recent Labs Lab 06/18/16 2128 06/20/16 0502 06/21/16 0842 06/22/16 0221 06/23/16 0256 06/24/16 0343  WBC 7.7 9.1 10.9* 8.3 10.1 7.3  NEUTROABS 4.2  --   --   --   --   --   HGB 13.5 10.5* 10.3* 9.3* 10.1* 8.8*  HCT 42.1  39.4 33.6* 32.6* 29.2* 30.8* 27.2*  MCV 105.0* 106.3* 107.2* 105.4* 103.4* 103.4*  PLT 162 121* 108*  116* 135* 145*   Cardiac Enzymes:  Recent Labs Lab 06/18/16 2128  CKTOTAL 43   BNP: BNP (last 3 results) No results for input(s): BNP in the last 8760 hours.  ProBNP (last 3 results) No results for input(s): PROBNP in the last 8760 hours.  CBG:  Recent Labs Lab 06/20/16 0817 06/21/16 0732 06/21/16 1143 06/23/16 0648 06/24/16 0649  GLUCAP 119* 101* 131* 114* 116*       Signed:  Cristal Ford  [facility] 06/24/2016, 10:21 AM

## 2016-06-24 NOTE — Discharge Instructions (Signed)
Peripheral Vascular Disease Peripheral vascular disease (PVD) is a disease of the blood vessels that are not part of your heart and brain. A simple term for PVD is poor circulation. In most cases, PVD narrows the blood vessels that carry blood from your heart to the rest of your body. This can result in a decreased supply of blood to your arms, legs, and internal organs, like your stomach or kidneys. However, it most often affects a person's lower legs and feet. There are two types of PVD.  Organic PVD. This is the more common type. It is caused by damage to the structure of blood vessels.  Functional PVD. This is caused by conditions that make blood vessels contract and tighten (spasm). Without treatment, PVD tends to get worse over time. PVD can also lead to acute ischemic limb. This is when an arm or limb suddenly has trouble getting enough blood. This is a medical emergency. CAUSES Each type of PVD has many different causes. The most common cause of PVD is buildup of a fatty material (plaque) inside of your arteries (atherosclerosis). Small amounts of plaque can break off from the walls of the blood vessels and become lodged in a smaller artery. This blocks blood flow and can cause acute ischemic limb. Other common causes of PVD include:  Blood clots that form inside of blood vessels.  Injuries to blood vessels.  Diseases that cause inflammation of blood vessels or cause blood vessel spasms.  Health behaviors and health history that increase your risk of developing PVD. RISK FACTORS  You may have a greater risk of PVD if you:  Have a family history of PVD.  Have certain medical conditions, including:  High cholesterol.  Diabetes.  High blood pressure (hypertension).  Coronary heart disease.  Past problems with blood clots.  Past injury, such as burns or a broken bone. These may have damaged blood vessels in your limbs.  Buerger disease. This is caused by inflamed blood  vessels in your hands and feet.  Some forms of arthritis.  Rare birth defects that affect the arteries in your legs.  Use tobacco.  Do not get enough exercise.  Are obese.  Are age 50 or older. SIGNS AND SYMPTOMS  PVD may cause many different symptoms. Your symptoms depend on what part of your body is not getting enough blood. Some common signs and symptoms include:  Cramps in your lower legs. This may be a symptom of poor leg circulation (claudication).  Pain and weakness in your legs while you are physically active that goes away when you rest (intermittent claudication).  Leg pain when at rest.  Leg numbness, tingling, or weakness.  Coldness in a leg or foot, especially when compared with the other leg.  Skin or hair changes. These can include:  Hair loss.  Shiny skin.  Pale or bluish skin.  Thick toenails.  Inability to get or maintain an erection (erectile dysfunction). People with PVD are more prone to developing ulcers and sores on their toes, feet, or legs. These may take longer than normal to heal. DIAGNOSIS Your health care provider may diagnose PVD from your signs and symptoms. The health care provider will also do a physical exam. You may have tests to find out what is causing your PVD and determine its severity. Tests may include:  Blood pressure recordings from your arms and legs and measurements of the strength of your pulses (pulse volume recordings).  Imaging studies using sound waves to take pictures of   the blood flow through your blood vessels (Doppler ultrasound).  Injecting a dye into your blood vessels before having imaging studies using:  X-rays (angiogram or arteriogram).  Computer-generated X-rays (CT angiogram).  A powerful electromagnetic field and a computer (magnetic resonance angiogram or MRA). TREATMENT Treatment for PVD depends on the cause of your condition and the severity of your symptoms. It also depends on your age. Underlying  causes need to be treated and controlled. These include long-lasting (chronic) conditions, such as diabetes, high cholesterol, and high blood pressure. You may need to first try making lifestyle changes and taking medicines. Surgery may be needed if these do not work. Lifestyle changes may include:  Quitting smoking.  Exercising regularly.  Following a low-fat, low-cholesterol diet. Medicines may include:  Blood thinners to prevent blood clots.  Medicines to improve blood flow.  Medicines to improve your blood cholesterol levels. Surgical procedures may include:  A procedure that uses an inflated balloon to open a blocked artery and improve blood flow (angioplasty).  A procedure to put in a tube (stent) to keep a blocked artery open (stent implant).  Surgery to reroute blood flow around a blocked artery (peripheral bypass surgery).  Surgery to remove dead tissue from an infected wound on the affected limb.  Amputation. This is surgical removal of the affected limb. This may be necessary in cases of acute ischemic limb that are not improved through medical or surgical treatments. HOME CARE INSTRUCTIONS  Take medicines only as directed by your health care provider.  Do not use any tobacco products, including cigarettes, chewing tobacco, or electronic cigarettes. If you need help quitting, ask your health care provider.  Lose weight if you are overweight, and maintain a healthy weight as directed by your health care provider.  Eat a diet that is low in fat and cholesterol. If you need help, ask your health care provider.  Exercise regularly. Ask your health care provider to suggest some good activities for you.  Use compression stockings or other mechanical devices as directed by your health care provider.  Take good care of your feet.  Wear comfortable shoes that fit well.  Check your feet often for any cuts or sores. SEEK MEDICAL CARE IF:  You have cramps in your legs  while walking.  You have leg pain when you are at rest.  You have coldness in a leg or foot.  Your skin changes.  You have erectile dysfunction.  You have cuts or sores on your feet that are not healing. SEEK IMMEDIATE MEDICAL CARE IF:  Your arm or leg turns cold and blue.  Your arms or legs become red, warm, swollen, painful, or numb.  You have chest pain or trouble breathing.  You suddenly have weakness in your face, arm, or leg.  You become very confused or lose the ability to speak.  You suddenly have a very bad headache or lose your vision.   This information is not intended to replace advice given to you by your health care provider. Make sure you discuss any questions you have with your health care provider.   Document Released: 12/21/2004 Document Revised: 12/04/2014 Document Reviewed: 04/23/2014 Elsevier Interactive Patient Education 2016 [employer] Inc[employer]  

## 2016-06-25 LAB — CULTURE, BLOOD (ROUTINE X 2)
CULTURE: NO GROWTH
CULTURE: NO GROWTH

## 2016-06-26 ENCOUNTER — Encounter (HOSPITAL_COMMUNITY): Payer: Medicare (Managed Care)

## 2016-06-26 ENCOUNTER — Ambulatory Visit: Payer: Medicare (Managed Care) | Admitting: Surgery

## 2016-06-27 ENCOUNTER — Telehealth: Payer: Self-pay | Admitting: Vascular Surgery

## 2016-06-27 NOTE — Telephone Encounter (Signed)
-----   Message from Mena Goes, RN sent at 06/24/2016  1:55 PM EDT ----- Regarding: 2 weeks postop   ----- Message ----- From: Ulyses Amor, PA-C Sent: 06/24/2016   6:35 AM To: [facility] Charge Pool  Ax fem-fem by pass f/u with Dr. Donnetta Hutching in 2 weeks

## 2016-06-27 NOTE — Telephone Encounter (Signed)
Sched appt 8/15 at 1:30. Spoke to [employer] at rehab facility to inform them of appt.

## 2016-07-06 ENCOUNTER — Encounter: Payer: Self-pay | Admitting: Vascular Surgery

## 2016-07-11 ENCOUNTER — Encounter: Payer: Self-pay | Admitting: Vascular Surgery

## 2016-07-11 ENCOUNTER — Ambulatory Visit (INDEPENDENT_AMBULATORY_CARE_PROVIDER_SITE_OTHER): Payer: Medicare (Managed Care) | Admitting: Vascular Surgery

## 2016-07-11 VITALS — BP 121/75 | HR 76 | Temp 98.7°F | Resp 18 | Ht 66.25 in | Wt 141.0 lb

## 2016-07-11 DIAGNOSIS — I70223 Atherosclerosis of native arteries of extremities with rest pain, bilateral legs: Secondary | ICD-10-CM

## 2016-07-11 NOTE — Progress Notes (Signed)
Patient name: Sheri Peterson MRN: [id number] DOB: 05-04-1956 Sex: female  REASON FOR VISIT: Postop  HPI: Sheri Peterson is a 60 y.o. female who presents for her first postoperative follow-up status post left axillary to femoral and femoral-femoral bypass with 8 mm  Hemashield graft by Dr. Donnetta Hutching on 06/19/2016. This was done for critical ischemia of the bilateral lower extremities, left greater than right. The patient states that her left foot numbness has improved although it is still present. The pain in her left foot has resolved. She is starting to ambulate with a walker. She is concerned about drainage from her groin incisions bilaterally. She states that it is brown and yellow. She denies any fever or chills. She complains of left lateral chest discomfort that corresponds to her left axillofemoral graft. She also had bilateral leg swelling that has significantly improved.  She has cut down on her smoking from half a pack a day to quarter of a pack a day. She takes Plavix, aspirin and a statin.  Current Outpatient Prescriptions  Medication Sig Dispense Refill  . abacavir-dolutegravir-lamiVUDine (TRIUMEQ) 600-50-300 MG tablet Take 1 tablet by mouth at bedtime.     Marland Kitchen acetaminophen (TYLENOL) 325 MG tablet Take 650 mg by mouth every 6 (six) hours as needed for mild pain.    Marland Kitchen albuterol (PROAIR HFA) 108 (90 Base) MCG/ACT inhaler Inhale 2 puffs into the lungs every 6 (six) hours as needed for wheezing or shortness of breath.    Marland Kitchen aspirin EC 81 MG tablet Take 81 mg by mouth daily.    . Cholecalciferol (VITAMIN D3) 5000 units TABS Take 5,000 Units by mouth daily.    . clopidogrel (PLAVIX) 75 MG tablet Take 75 mg by mouth daily. Reported on 05/15/2016  0  . doxepin (SINEQUAN) 50 MG capsule Take 50-100 mg by mouth at bedtime.     Marland Kitchen FLUoxetine (PROZAC) 20 MG tablet Take 20 mg by mouth daily.    Marland Kitchen gabapentin (NEURONTIN) 600 MG tablet Take 600 mg by mouth 3 (three) times daily.    Marland Kitchen lidocaine (LIDODERM)  5 % Place 1 patch onto the skin daily. Remove & Discard patch within 12 hours or as directed by MD    . Menthol-Methyl Salicylate (MUSCLE RUB) 10-15 % CREA Apply 1 application topically 2 (two) times daily as needed for muscle pain.    Marland Kitchen ondansetron (ZOFRAN) 4 MG tablet Take 1 tablet (4 mg total) by mouth every 6 (six) hours as needed for nausea. 20 tablet 0  . OVER THE COUNTER MEDICATION Place 1 drop into both eyes 3 (three) times daily as needed (dry eyes). Over the counter lubricant eye drops    . oxyCODONE (OXY IR/ROXICODONE) 5 MG immediate release tablet Take 5-10 mg by mouth every 6 (six) hours as needed for severe pain (pain).    . ranitidine (ZANTAC) 150 MG tablet Take 150 mg by mouth daily.    . rosuvastatin (CRESTOR) 10 MG tablet Take 10 mg by mouth at bedtime.     . bisacodyl (DULCOLAX) 5 MG EC tablet Take 1 tablet (5 mg total) by mouth daily as needed for moderate constipation. (Patient not taking: Reported on 07/11/2016) 30 tablet 0  . docusate sodium (COLACE) 100 MG capsule Take 1 capsule (100 mg total) by mouth daily. (Patient not taking: Reported on 07/11/2016) 10 capsule 0  . mupirocin ointment (BACTROBAN) 2 % Place 1 application into the nose 2 (two) times daily. (Patient not taking: Reported on 07/11/2016) 22  g 0  . polyethylene glycol (MIRALAX / GLYCOLAX) packet Take 17 g by mouth daily as needed for mild constipation. (Patient not taking: Reported on 07/11/2016) 14 each 0   No current facility-administered medications for this visit.     REVIEW OF SYSTEMS:  [X]  denotes positive finding, [ ]  denotes negative finding Cardiac  Comments:  Chest pain or chest pressure:    Shortness of breath upon exertion:    Short of breath when lying flat:    Irregular heart rhythm:    Constitutional    Fever or chills:      PHYSICAL EXAM: Vitals:   07/11/16 1346  BP: 121/75  Pulse: 76  Resp: 18  Temp: 98.7 F (37.1 C)  TempSrc: Oral  Weight: 141 lb (64 kg)  Height: 5' 6.25" (1.683  m)    GENERAL: The patient is a well-nourished female, in no acute distress. The vital signs are documented above. PULMONARY: Non-labored respiratory efforts VASCULAR: Left infraclavicular incision healed. Palpable left ax-fem graft pulse. Bilateral groin incisions clean. There is mild superficial separation at the proximal aspect of the left groin incision. No drainage seen. On the right there is an area of mild separation towards the middle of the incision. There is mild erythema around the incision. The drainage seen. Non-palpable pedal pulses. Feet are warm and well perfused.  MEDICAL ISSUES: Status post left axillary to femoral and femoral-femoral bypass  The patient is doing well. Her left foot pain has resolved and her numbness is improving. She does have some mild separation of her groin incisions bilaterally. There is some mild erythema around the right groin incision. We will prescribe a ten-day course of Keflex to cover for infection. Discussed that if her graft becomes infected, this will require extensive surgery. She will follow up in 6 weeks with ABIs.  Virgina Jock, PA-C [facility] [place]      I have examined the patient, reviewed and agree with above. Mild erythema in her right groin. Did have a Vicryl exposed which was removed. Will have a short course of oral Keflex see her again in 6 weeks. Has had resolution of her critical limb ischemia  Curt Jews, MD 07/11/2016 3:26 PM

## 2016-07-12 ENCOUNTER — Other Ambulatory Visit: Payer: Self-pay | Admitting: Vascular Surgery

## 2016-07-12 ENCOUNTER — Telehealth: Payer: Self-pay | Admitting: *Deleted

## 2016-07-12 DIAGNOSIS — I739 Peripheral vascular disease, unspecified: Secondary | ICD-10-CM

## 2016-07-12 NOTE — Telephone Encounter (Signed)
Spoke with Jonelle Sidle (nurse at [facility]) to verify that order had been placed for 10 day course of Keflex from [facility] yesterday afternoon.  Tiffany confirms that order for10 day course of Keflex was received  by [facility] on 07-11-2016 . She stated Keflex should arrive today (07-12-2016) and the patient would start Keflex as soon as it arrives.  Tiffany also stated that the PCP that manages Ms. Barner's care at [facility] is Marco Collie MD.

## 2016-08-03 ENCOUNTER — Telehealth: Payer: Self-pay

## 2016-08-03 NOTE — Telephone Encounter (Signed)
Per Carol's note from 08/03/16 I scheduled this patient to see Suzanne,NP and have abi's prior on Friday 08/11/16 at 11am. I spoke w/ Jeralyn Ruths at [facility] and they will contact pt and arrange transportation for her as well. awt

## 2016-08-03 NOTE — Telephone Encounter (Signed)
rec'd phone call from Nurse Practitioner at [facility] re: pt. with c/o left foot throbbing, and tingling, and of blue discoloration, proximal to the great toe; reported the left foot cooler than the right.  Attempted to contact the NP at this time, and left voice message that will contact the pt. Re: her current symptoms.  Phone call to the pt.  Stated the left great toe is peeling and the foot gets cold at times, and then warms up.  Reported her left foot has intermittent numbness/ tingling, that has been more noticeable over past week.  Described the pain in foot as if she is getting "zapped" at times.  Discussed symptoms with Dr. Donnetta Hutching.  Recommended to schedule appt. Next week for ABI's and to see either him or the NP.  Will have an Appointment Scheduler contact the pt. with the appt. Information.

## 2016-08-04 ENCOUNTER — Encounter: Payer: Self-pay | Admitting: Family

## 2016-08-11 ENCOUNTER — Encounter: Payer: Self-pay | Admitting: Family

## 2016-08-11 ENCOUNTER — Ambulatory Visit (INDEPENDENT_AMBULATORY_CARE_PROVIDER_SITE_OTHER): Payer: Medicare (Managed Care) | Admitting: Family

## 2016-08-11 ENCOUNTER — Ambulatory Visit (HOSPITAL_COMMUNITY)
Admission: RE | Admit: 2016-08-11 | Discharge: 2016-08-11 | Disposition: A | Discharge status: Home / Self Care | Payer: Medicare (Managed Care) | Source: Ambulatory Visit | Attending: Vascular Surgery | Admitting: Vascular Surgery

## 2016-08-11 VITALS — BP 115/78 | HR 77 | Temp 97.2°F | Resp 14 | Ht 67.0 in | Wt 135.0 lb

## 2016-08-11 DIAGNOSIS — I779 Disorder of arteries and arterioles, unspecified: Secondary | ICD-10-CM

## 2016-08-11 DIAGNOSIS — I739 Peripheral vascular disease, unspecified: Secondary | ICD-10-CM | POA: Diagnosis not present

## 2016-08-11 DIAGNOSIS — I70213 Atherosclerosis of native arteries of extremities with intermittent claudication, bilateral legs: Secondary | ICD-10-CM

## 2016-08-11 DIAGNOSIS — F17200 Nicotine dependence, unspecified, uncomplicated: Secondary | ICD-10-CM

## 2016-08-11 DIAGNOSIS — Z720 Tobacco use: Secondary | ICD-10-CM

## 2016-08-11 NOTE — Progress Notes (Signed)
[facility] [place]   CC: Follow up peripheral artery occlusive disease  History of Present Illness Sheri Peterson is a 60 y.o. female patient of Dr. Donnetta Hutching  who is status post left axillary to femoral and femoral-femoral bypass with 8 mm Hemashield graft by Dr. Donnetta Hutching on 06/19/2016. This was done for critical ischemia of the bilateral lower extremities, left greater than right. The patient states that her left foot numbness has improved although it is still present. The pain in her left foot has resolved. She is ambulating without a walker.  Both groin incisions have healed with no further drainage.  She complains of left lateral chest discomfort that corresponds to her left axillofemoral graft. She  No longer has bilateral leg swelling.  She takes Plavix, aspirin and a statin.  She denies tingling, numbness, aching, or cold sensation in either hand/arm.   Pt Diabetic: No Pt smoker: smoker  (1/4 ppd x since age 44 yrs)  Pt meds include: Statin :Yes Betablocker: No ASA: Yes Other anticoagulants/antiplatelets: Plavix   Past Medical History:  Diagnosis Date  . Anxiety   . Arthritis   . Asthma   . Chronic kidney disease    ckd  . COPD (chronic obstructive pulmonary disease) ([place])   . Depression   . Emphysema of lung ([place])   . GERD (gastroesophageal reflux disease)   . Hepatitis    Hep C per H&P by Dr. Trula Slade  . HIV infection [facility])   . Hyperlipidemia   . Hypertension   . Myocardial infarction ([place])   . PAD (peripheral artery disease) ([place])   . Seizures ([place])   . Substance abuse     Social History Social History  Substance Use Topics  . Smoking status: Current Every Day Smoker    Packs/day: 0.25    Years: 46.00    Types: Cigarettes  . Smokeless tobacco: Never Used     Comment: Ialready have information "  . Alcohol use No    Family History No family history on file.  Past Surgical History:  Procedure Laterality Date  . AXILLARY-FEMORAL  BYPASS GRAFT Bilateral 06/19/2016   Procedure: LEFT  AXILLO-BIFEMORAL BYPASS GRAFT;  Surgeon: Rosetta Posner, MD;  Location: [place];  Service: Vascular;  Laterality: Bilateral;  . CESAREAN SECTION  X 2  . CORONARY ARTERY BYPASS GRAFT  12/2015   CABG X 3"  . MYRINGOTOMY WITH TUBE PLACEMENT Bilateral "as a child"  . PERIPHERAL VASCULAR CATHETERIZATION Left 06/13/2016   Procedure: Lower Extremity Angiography;  Surgeon: Serafina Mitchell, MD;  Location: [facility];  Service: Cardiovascular;  Laterality: Left;  . STENT PLACEMENT VASCULAR ([place] HX) Left    groin - 2011/2012?  . TONSILLECTOMY     "as a child"  . TUBAL LIGATION    . TUBAL LIGATION      Allergies  Allergen Reactions  . Morphine And Related Itching and Rash    Current Outpatient Prescriptions  Medication Sig Dispense Refill  . abacavir-dolutegravir-lamiVUDine (TRIUMEQ) 600-50-300 MG tablet Take 1 tablet by mouth at bedtime.     Marland Kitchen acetaminophen (TYLENOL) 325 MG tablet Take 650 mg by mouth every 6 (six) hours as needed for mild pain.    Marland Kitchen albuterol (PROAIR HFA) 108 (90 Base) MCG/ACT inhaler Inhale 2 puffs into the lungs every 6 (six) hours as needed for wheezing or shortness of breath.    Marland Kitchen aspirin EC 81 MG tablet Take 81 mg by mouth daily.    Marland Kitchen  Cholecalciferol (VITAMIN D3) 5000 units TABS Take 5,000 Units by mouth daily.    . clopidogrel (PLAVIX) 75 MG tablet Take 75 mg by mouth daily. Reported on 05/15/2016  0  . doxepin (SINEQUAN) 50 MG capsule Take 50-100 mg by mouth at bedtime.     Marland Kitchen FLUoxetine (PROZAC) 20 MG tablet Take 20 mg by mouth daily.    Marland Kitchen gabapentin (NEURONTIN) 600 MG tablet Take 600 mg by mouth 3 (three) times daily.    Marland Kitchen lidocaine (LIDODERM) 5 % Place 1 patch onto the skin daily. Remove & Discard patch within 12 hours or as directed by MD    . Menthol-Methyl Salicylate (MUSCLE RUB) 10-15 % CREA Apply 1 application topically 2 (two) times daily as needed for muscle pain.    . mupirocin ointment (BACTROBAN) 2 %  Place 1 application into the nose 2 (two) times daily. 22 g 0  . OVER THE COUNTER MEDICATION Place 1 drop into both eyes 3 (three) times daily as needed (dry eyes). Over the counter lubricant eye drops    . oxyCODONE (OXY IR/ROXICODONE) 5 MG immediate release tablet Take 5-10 mg by mouth every 6 (six) hours as needed for severe pain (pain).    . ranitidine (ZANTAC) 150 MG tablet Take 150 mg by mouth daily.    . rosuvastatin (CRESTOR) 10 MG tablet Take 10 mg by mouth at bedtime.     . bisacodyl (DULCOLAX) 5 MG EC tablet Take 1 tablet (5 mg total) by mouth daily as needed for moderate constipation. (Patient not taking: Reported on 08/11/2016) 30 tablet 0  . docusate sodium (COLACE) 100 MG capsule Take 1 capsule (100 mg total) by mouth daily. (Patient not taking: Reported on 08/11/2016) 10 capsule 0  . ondansetron (ZOFRAN) 4 MG tablet Take 1 tablet (4 mg total) by mouth every 6 (six) hours as needed for nausea. (Patient not taking: Reported on 08/11/2016) 20 tablet 0  . polyethylene glycol (MIRALAX / GLYCOLAX) packet Take 17 g by mouth daily as needed for mild constipation. (Patient not taking: Reported on 08/11/2016) 14 each 0   No current facility-administered medications for this visit.     ROS: See HPI for pertinent positives and negatives.   Physical Examination  Vitals:   08/11/16 1204  BP: 115/78  Pulse: 77  Resp: 14  Temp: 97.2 F (36.2 C)  SpO2: 93%  Weight: 135 lb (61.2 kg)  Height: 5\' 7"  (1.702 m)   Body mass index is 21.14 kg/m.  General: A&O x 3, WDWN. Gait: normal Eyes: PERRLA. Pulmonary: Respirations are non labored, CTAB, fair air movement Cardiac: regular rhythm and rate, no detected murmur.         Carotid Bruits Right Left   Negative Negative  Aorta is not palpable. Radial pulses: 1+ right, faint left, left brachial is 1+ palpable                            VASCULAR EXAM: Extremities without ischemic changes  without Gangrene; without open wounds. Tip of left  great toe is slightly ruddy. Fem-fem bypass graft and left ax-fem bypass graft with palpable pulses  LE Pulses Right Left       FEMORAL   palpable   palpable        POPLITEAL  not palpable   not palpable       POSTERIOR TIBIAL   palpable   not palpable        DORSALIS PEDIS      ANTERIOR TIBIAL  palpable  not palpable    Abdomen: soft, NT, no palpable masses. Skin: no rashes, no ulcers. Musculoskeletal: no muscle wasting or atrophy.  Neurologic: A&O X 3; Appropriate Affect ; SENSATION: normal; MOTOR FUNCTION:  moving all extremities equally, motor strength 5/5 throughout. Speech is fluent/normal. CN 2-12 intact.    ASSESSMENT: Sheri Peterson is a 60 y.o. female who is status post left axillary to femoral and femoral-femoral bypass with 8 mm Hemashield graft on 06/19/2016. There are no signs of ischemia in her feet/legs. She does not have claudication with walking.  Her atherosclerotic risk factors include continued smoking (x 43 years) and CAD. Fortunately she does not have DM.  DATA ABI's today demonstrate 87% arterial perfusion in right LE with bi and monophasic waveforms, a decrease from 92% post operative at [facility].  Left ABI 69% today with monophasic waveforms compared to 57% post op at [facility].   PLAN:  The patient was counseled re smoking cessation and given several free resources re smoking cessation.  Graduated walking program discussed and how to achieve.    Based on the patient's vascular studies and examination, pt will return to clinic in 3 months with ABI's, according to surveillance timeline.  I discussed in depth with the patient the nature of atherosclerosis, and emphasized the importance of maximal medical management including strict control of blood pressure, blood glucose, and lipid levels, obtaining regular exercise, and cessation of smoking.  The patient is  aware that without maximal medical management the underlying atherosclerotic disease process will progress, limiting the benefit of any interventions.  The patient was given information about PAD including signs, symptoms, treatment, what symptoms should prompt the patient to seek immediate medical care, and risk reduction measures to take.  Clemon Chambers, RN, MSN, FNP-C [facility] Phone: [phone]  Clinic MD: Early  08/11/16 12:41 PM

## 2016-08-11 NOTE — Patient Instructions (Addendum)
Peripheral Vascular Disease Peripheral vascular disease (PVD) is a disease of the blood vessels that are not part of your heart and brain. A simple term for PVD is poor circulation. In most cases, PVD narrows the blood vessels that carry blood from your heart to the rest of your body. This can result in a decreased supply of blood to your arms, legs, and internal organs, like your stomach or kidneys. However, it most often affects a person's lower legs and feet. There are two types of PVD.  Organic PVD. This is the more common type. It is caused by damage to the structure of blood vessels.  Functional PVD. This is caused by conditions that make blood vessels contract and tighten (spasm). Without treatment, PVD tends to get worse over time. PVD can also lead to acute ischemic limb. This is when an arm or limb suddenly has trouble getting enough blood. This is a medical emergency. CAUSES Each type of PVD has many different causes. The most common cause of PVD is buildup of a fatty material (plaque) inside of your arteries (atherosclerosis). Small amounts of plaque can break off from the walls of the blood vessels and become lodged in a smaller artery. This blocks blood flow and can cause acute ischemic limb. Other common causes of PVD include:  Blood clots that form inside of blood vessels.  Injuries to blood vessels.  Diseases that cause inflammation of blood vessels or cause blood vessel spasms.  Health behaviors and health history that increase your risk of developing PVD. RISK FACTORS  You may have a greater risk of PVD if you:  Have a family history of PVD.  Have certain medical conditions, including:  High cholesterol.  Diabetes.  High blood pressure (hypertension).  Coronary heart disease.  Past problems with blood clots.  Past injury, such as burns or a broken bone. These may have damaged blood vessels in your limbs.  Buerger disease. This is caused by inflamed blood  vessels in your hands and feet.  Some forms of arthritis.  Rare birth defects that affect the arteries in your legs.  Use tobacco.  Do not get enough exercise.  Are obese.  Are age 50 or older. SIGNS AND SYMPTOMS  PVD may cause many different symptoms. Your symptoms depend on what part of your body is not getting enough blood. Some common signs and symptoms include:  Cramps in your lower legs. This may be a symptom of poor leg circulation (claudication).  Pain and weakness in your legs while you are physically active that goes away when you rest (intermittent claudication).  Leg pain when at rest.  Leg numbness, tingling, or weakness.  Coldness in a leg or foot, especially when compared with the other leg.  Skin or hair changes. These can include:  Hair loss.  Shiny skin.  Pale or bluish skin.  Thick toenails.  Inability to get or maintain an erection (erectile dysfunction). People with PVD are more prone to developing ulcers and sores on their toes, feet, or legs. These may take longer than normal to heal. DIAGNOSIS Your health care provider may diagnose PVD from your signs and symptoms. The health care provider will also do a physical exam. You may have tests to find out what is causing your PVD and determine its severity. Tests may include:  Blood pressure recordings from your arms and legs and measurements of the strength of your pulses (pulse volume recordings).  Imaging studies using sound waves to take pictures of   the blood flow through your blood vessels (Doppler ultrasound).  Injecting a dye into your blood vessels before having imaging studies using:  X-rays (angiogram or arteriogram).  Computer-generated X-rays (CT angiogram).  A powerful electromagnetic field and a computer (magnetic resonance angiogram or MRA). TREATMENT Treatment for PVD depends on the cause of your condition and the severity of your symptoms. It also depends on your age. Underlying  causes need to be treated and controlled. These include long-lasting (chronic) conditions, such as diabetes, high cholesterol, and high blood pressure. You may need to first try making lifestyle changes and taking medicines. Surgery may be needed if these do not work. Lifestyle changes may include:  Quitting smoking.  Exercising regularly.  Following a low-fat, low-cholesterol diet. Medicines may include:  Blood thinners to prevent blood clots.  Medicines to improve blood flow.  Medicines to improve your blood cholesterol levels. Surgical procedures may include:  A procedure that uses an inflated balloon to open a blocked artery and improve blood flow (angioplasty).  A procedure to put in a tube (stent) to keep a blocked artery open (stent implant).  Surgery to reroute blood flow around a blocked artery (peripheral bypass surgery).  Surgery to remove dead tissue from an infected wound on the affected limb.  Amputation. This is surgical removal of the affected limb. This may be necessary in cases of acute ischemic limb that are not improved through medical or surgical treatments. HOME CARE INSTRUCTIONS  Take medicines only as directed by your health care provider.  Do not use any tobacco products, including cigarettes, chewing tobacco, or electronic cigarettes. If you need help quitting, ask your health care provider.  Lose weight if you are overweight, and maintain a healthy weight as directed by your health care provider.  Eat a diet that is low in fat and cholesterol. If you need help, ask your health care provider.  Exercise regularly. Ask your health care provider to suggest some good activities for you.  Use compression stockings or other mechanical devices as directed by your health care provider.  Take good care of your feet.  Wear comfortable shoes that fit well.  Check your feet often for any cuts or sores. SEEK MEDICAL CARE IF:  You have cramps in your legs  while walking.  You have leg pain when you are at rest.  You have coldness in a leg or foot.  Your skin changes.  You have erectile dysfunction.  You have cuts or sores on your feet that are not healing. SEEK IMMEDIATE MEDICAL CARE IF:  Your arm or leg turns cold and blue.  Your arms or legs become red, warm, swollen, painful, or numb.  You have chest pain or trouble breathing.  You suddenly have weakness in your face, arm, or leg.  You become very confused or lose the ability to speak.  You suddenly have a very bad headache or lose your vision.   This information is not intended to replace advice given to you by your health care provider. Make sure you discuss any questions you have with your health care provider.   Document Released: 12/21/2004 Document Revised: 12/04/2014 Document Reviewed: 04/23/2014 Elsevier Interactive Patient Education 2016 [employer]     Steps to Quit Smoking  Smoking tobacco can be harmful to your health and can affect almost every organ in your body. Smoking puts you, and those around you, at risk for developing many serious chronic diseases. Quitting smoking is difficult, but it is one of   the best things that you can do for your health. It is never too late to quit. WHAT ARE THE BENEFITS OF QUITTING SMOKING? When you quit smoking, you lower your risk of developing serious diseases and conditions, such as:  Lung cancer or lung disease, such as COPD.  Heart disease.  Stroke.  Heart attack.  Infertility.  Osteoporosis and bone fractures. Additionally, symptoms such as coughing, wheezing, and shortness of breath may get better when you quit. You may also find that you get sick less often because your body is stronger at fighting off colds and infections. If you are pregnant, quitting smoking can help to reduce your chances of having a baby of low birth weight. HOW DO I GET READY TO QUIT? When you decide to quit smoking, create a plan to  make sure that you are successful. Before you quit:  Pick a date to quit. Set a date within the next two weeks to give you time to prepare.  Write down the reasons why you are quitting. Keep this list in places where you will see it often, such as on your bathroom mirror or in your car or wallet.  Identify the people, places, things, and activities that make you want to smoke (triggers) and avoid them. Make sure to take these actions:  Throw away all cigarettes at home, at work, and in your car.  Throw away smoking accessories, such as ashtrays and lighters.  Clean your car and make sure to empty the ashtray.  Clean your home, including curtains and carpets.  Tell your family, friends, and coworkers that you are quitting. Support from your loved ones can make quitting easier.  Talk with your health care provider about your options for quitting smoking.  Find out what treatment options are covered by your health insurance. WHAT STRATEGIES CAN I USE TO QUIT SMOKING?  Talk with your healthcare provider about different strategies to quit smoking. Some strategies include:  Quitting smoking altogether instead of gradually lessening how much you smoke over a period of time. Research shows that quitting "cold turkey" is more successful than gradually quitting.  Attending in-person counseling to help you build problem-solving skills. You are more likely to have success in quitting if you attend several counseling sessions. Even short sessions of 10 minutes can be effective.  Finding resources and support systems that can help you to quit smoking and remain smoke-free after you quit. These resources are most helpful when you use them often. They can include:  Online chats with a counselor.  Telephone quitlines.  Printed self-help materials.  Support groups or group counseling.  Text messaging programs.  Mobile phone applications.  Taking medicines to help you quit smoking. (If you are  pregnant or breastfeeding, talk with your health care provider first.) Some medicines contain nicotine and some do not. Both types of medicines help with cravings, but the medicines that include nicotine help to relieve withdrawal symptoms. Your health care provider may recommend:  Nicotine patches, gum, or lozenges.  Nicotine inhalers or sprays.  Non-nicotine medicine that is taken by mouth. Talk with your health care provider about combining strategies, such as taking medicines while you are also receiving in-person counseling. Using these two strategies together makes you more likely to succeed in quitting than if you used either strategy on its own. If you are pregnant or breastfeeding, talk with your health care provider about finding counseling or other support strategies to quit smoking. Do not take medicine to help you   quit smoking unless told to do so by your health care provider. WHAT THINGS CAN I DO TO MAKE IT EASIER TO QUIT? Quitting smoking might feel overwhelming at first, but there is a lot that you can do to make it easier. Take these important actions:  Reach out to your family and friends and ask that they support and encourage you during this time. Call telephone quitlines, reach out to support groups, or work with a counselor for support.  Ask people who smoke to avoid smoking around you.  Avoid places that trigger you to smoke, such as bars, parties, or smoke-break areas at work.  Spend time around people who do not smoke.  Lessen stress in your life, because stress can be a smoking trigger for some people. To lessen stress, try:  Exercising regularly.  Deep-breathing exercises.  Yoga.  Meditating.  Performing a body scan. This involves closing your eyes, scanning your body from head to toe, and noticing which parts of your body are particularly tense. Purposefully relax the muscles in those areas.  Download or purchase mobile phone or tablet apps (applications)  that can help you stick to your quit plan by providing reminders, tips, and encouragement. There are many free apps, such as QuitGuide from the CDC (Centers for Disease Control and Prevention). You can find other support for quitting smoking (smoking cessation) through smokefree.gov and other websites. HOW WILL I FEEL WHEN I QUIT SMOKING? Within the first 24 hours of quitting smoking, you may start to feel some withdrawal symptoms. These symptoms are usually most noticeable 2-3 days after quitting, but they usually do not last beyond 2-3 weeks. Changes or symptoms that you might experience include:  Mood swings.  Restlessness, anxiety, or irritation.  Difficulty concentrating.  Dizziness.  Strong cravings for sugary foods in addition to nicotine.  Mild weight gain.  Constipation.  Nausea.  Coughing or a sore throat.  Changes in how your medicines work in your body.  A depressed mood.  Difficulty sleeping (insomnia). After the first 2-3 weeks of quitting, you may start to notice more positive results, such as:  Improved sense of smell and taste.  Decreased coughing and sore throat.  Slower heart rate.  Lower blood pressure.  Clearer skin.  The ability to breathe more easily.  Fewer sick days. Quitting smoking is very challenging for most people. Do not get discouraged if you are not successful the first time. Some people need to make many attempts to quit before they achieve long-term success. Do your best to stick to your quit plan, and talk with your health care provider if you have any questions or concerns.   This information is not intended to replace advice given to you by your health care provider. Make sure you discuss any questions you have with your health care provider.   Document Released: 11/07/2001 Document Revised: 03/30/2015 Document Reviewed: 03/30/2015 Elsevier Interactive Patient Education 2016 [employer]  

## 2016-08-22 ENCOUNTER — Ambulatory Visit: Payer: Medicare (Managed Care) | Admitting: Vascular Surgery

## 2016-08-22 ENCOUNTER — Encounter (HOSPITAL_COMMUNITY): Payer: Medicare (Managed Care)

## 2016-10-13 DIAGNOSIS — B20 Human immunodeficiency virus [HIV] disease: Secondary | ICD-10-CM | POA: Diagnosis not present

## 2016-11-07 ENCOUNTER — Encounter: Payer: Self-pay | Admitting: Family

## 2016-11-13 ENCOUNTER — Other Ambulatory Visit: Payer: Self-pay | Admitting: *Deleted

## 2016-11-13 DIAGNOSIS — Z48812 Encounter for surgical aftercare following surgery on the circulatory system: Secondary | ICD-10-CM

## 2016-11-13 DIAGNOSIS — I739 Peripheral vascular disease, unspecified: Secondary | ICD-10-CM

## 2016-11-14 ENCOUNTER — Ambulatory Visit (INDEPENDENT_AMBULATORY_CARE_PROVIDER_SITE_OTHER): Payer: Medicare (Managed Care) | Admitting: Family

## 2016-11-14 ENCOUNTER — Encounter: Payer: Self-pay | Admitting: Family

## 2016-11-14 ENCOUNTER — Ambulatory Visit (HOSPITAL_COMMUNITY)
Admission: RE | Admit: 2016-11-14 | Discharge: 2016-11-14 | Disposition: A | Discharge status: Home / Self Care | Payer: Medicare (Managed Care) | Source: Ambulatory Visit | Attending: Family | Admitting: Family

## 2016-11-14 VITALS — BP 118/70 | HR 77 | Temp 98.1°F | Resp 18 | Ht 67.0 in | Wt 143.1 lb

## 2016-11-14 DIAGNOSIS — E1151 Type 2 diabetes mellitus with diabetic peripheral angiopathy without gangrene: Secondary | ICD-10-CM | POA: Insufficient documentation

## 2016-11-14 DIAGNOSIS — I7409 Other arterial embolism and thrombosis of abdominal aorta: Secondary | ICD-10-CM | POA: Diagnosis not present

## 2016-11-14 DIAGNOSIS — I10 Essential (primary) hypertension: Secondary | ICD-10-CM | POA: Diagnosis not present

## 2016-11-14 DIAGNOSIS — I779 Disorder of arteries and arterioles, unspecified: Secondary | ICD-10-CM | POA: Diagnosis not present

## 2016-11-14 DIAGNOSIS — F17200 Nicotine dependence, unspecified, uncomplicated: Secondary | ICD-10-CM | POA: Diagnosis not present

## 2016-11-14 DIAGNOSIS — E785 Hyperlipidemia, unspecified: Secondary | ICD-10-CM | POA: Diagnosis not present

## 2016-11-14 DIAGNOSIS — Z48812 Encounter for surgical aftercare following surgery on the circulatory system: Secondary | ICD-10-CM | POA: Diagnosis not present

## 2016-11-14 DIAGNOSIS — R938 Abnormal findings on diagnostic imaging of other specified body structures: Secondary | ICD-10-CM | POA: Insufficient documentation

## 2016-11-14 DIAGNOSIS — R0989 Other specified symptoms and signs involving the circulatory and respiratory systems: Secondary | ICD-10-CM | POA: Diagnosis present

## 2016-11-14 DIAGNOSIS — I739 Peripheral vascular disease, unspecified: Secondary | ICD-10-CM | POA: Diagnosis not present

## 2016-11-14 DIAGNOSIS — Z720 Tobacco use: Secondary | ICD-10-CM | POA: Insufficient documentation

## 2016-11-14 NOTE — Patient Instructions (Signed)
Peripheral Vascular Disease Peripheral vascular disease (PVD) is a disease of the blood vessels that are not part of your heart and brain. A simple term for PVD is poor circulation. In most cases, PVD narrows the blood vessels that carry blood from your heart to the rest of your body. This can result in a decreased supply of blood to your arms, legs, and internal organs, like your stomach or kidneys. However, it most often affects a person's lower legs and feet. There are two types of PVD.  Organic PVD. This is the more common type. It is caused by damage to the structure of blood vessels.  Functional PVD. This is caused by conditions that make blood vessels contract and tighten (spasm). Without treatment, PVD tends to get worse over time. PVD can also lead to acute ischemic limb. This is when an arm or limb suddenly has trouble getting enough blood. This is a medical emergency. Follow these instructions at home:  Take medicines only as told by your doctor.  Do not use any tobacco products, including cigarettes, chewing tobacco, or electronic cigarettes. If you need help quitting, ask your doctor.  Lose weight if you are overweight, and maintain a healthy weight as told by your doctor.  Eat a diet that is low in fat and cholesterol. If you need help, ask your doctor.  Exercise regularly. Ask your doctor for some good activities for you.  Take good care of your feet.  Wear comfortable shoes that fit well.  Check your feet often for any cuts or sores. Contact a doctor if:  You have cramps in your legs while walking.  You have leg pain when you are at rest.  You have coldness in a leg or foot.  Your skin changes.  You are unable to get or have an erection (erectile dysfunction).  You have cuts or sores on your feet that are not healing. Get help right away if:  Your arm or leg turns cold and blue.  Your arms or legs become red, warm, swollen, painful, or numb.  You have  chest pain or trouble breathing.  You suddenly have weakness in your face, arm, or leg.  You become very confused or you cannot speak.  You suddenly have a very bad headache.  You suddenly cannot see. This information is not intended to replace advice given to you by your health care provider. Make sure you discuss any questions you have with your health care provider. Document Released: 02/07/2010 Document Revised: 04/20/2016 Document Reviewed: 04/23/2014 Elsevier Interactive Patient Education  2017 [employer]      Steps to Quit Smoking Smoking tobacco can be bad for your health. It can also affect almost every organ in your body. Smoking puts you and people around you at risk for many serious long-lasting (chronic) diseases. Quitting smoking is hard, but it is one of the best things that you can do for your health. It is never too late to quit. What are the benefits of quitting smoking? When you quit smoking, you lower your risk for getting serious diseases and conditions. They can include:  Lung cancer or lung disease.  Heart disease.  Stroke.  Heart attack.  Not being able to have children (infertility).  Weak bones (osteoporosis) and broken bones (fractures). If you have coughing, wheezing, and shortness of breath, those symptoms may get better when you quit. You may also get sick less often. If you are pregnant, quitting smoking can help to lower your chances   of having a baby of low birth weight. What can I do to help me quit smoking? Talk with your doctor about what can help you quit smoking. Some things you can do (strategies) include:  Quitting smoking totally, instead of slowly cutting back how much you smoke over a period of time.  Going to in-person counseling. You are more likely to quit if you go to many counseling sessions.  Using resources and support systems, such as:  Online chats with a counselor.  Phone quitlines.  Printed self-help  materials.  Support groups or group counseling.  Text messaging programs.  Mobile phone apps or applications.  Taking medicines. Some of these medicines may have nicotine in them. If you are pregnant or breastfeeding, do not take any medicines to quit smoking unless your doctor says it is okay. Talk with your doctor about counseling or other things that can help you. Talk with your doctor about using more than one strategy at the same time, such as taking medicines while you are also going to in-person counseling. This can help make quitting easier. What things can I do to make it easier to quit? Quitting smoking might feel very hard at first, but there is a lot that you can do to make it easier. Take these steps:  Talk to your family and friends. Ask them to support and encourage you.  Call phone quitlines, reach out to support groups, or work with a counselor.  Ask people who smoke to not smoke around you.  Avoid places that make you want (trigger) to smoke, such as:  Bars.  Parties.  Smoke-break areas at work.  Spend time with people who do not smoke.  Lower the stress in your life. Stress can make you want to smoke. Try these things to help your stress:  Getting regular exercise.  Deep-breathing exercises.  Yoga.  Meditating.  Doing a body scan. To do this, close your eyes, focus on one area of your body at a time from head to toe, and notice which parts of your body are tense. Try to relax the muscles in those areas.  Download or buy apps on your mobile phone or tablet that can help you stick to your quit plan. There are many free apps, such as QuitGuide from the CDC (Centers for Disease Control and Prevention). You can find more support from smokefree.gov and other websites. This information is not intended to replace advice given to you by your health care provider. Make sure you discuss any questions you have with your health care provider. Document Released:  09/09/2009 Document Revised: 07/11/2016 Document Reviewed: 03/30/2015 Elsevier Interactive Patient Education  2017 [employer]  

## 2016-11-14 NOTE — Progress Notes (Signed)
VASCULAR & VEIN SPECIALISTS OF [place]   CC: Follow up peripheral artery occlusive disease  History of Present Illness Sheri Peterson is a 60 y.o. femalewho is status post left axillary to femoral and femoral-femoral bypass with 8 mm Hemashield graft by Dr. Donnetta Hutching on 06/19/2016. This was done for critical ischemia of the bilateral lower extremities, left greater than right. The patient states that her left foot numbness has improved although it is still present. The pain in her left foot has resolved. She is ambulating without a walker. She complains of left lateral chest discomfort that corresponds to her left axillofemoral graft. She  No longer has bilateral leg swelling.  She has back pain, pt reports that her PCP prescribes her analgesics and psychiatric meds, states her HIV meds are prescribed by ID at [facility].   She takes Plavix,aspirin and a statin.  She denies tingling, numbness, aching, or cold sensation in either hand/arm.   She is exercising 3 days/week at a program in [facility], states she feels better.   Pt Diabetic: No Pt smoker: smoker  (1/2 ppd x since age 1 yrs), states she has no intention to quit.   Pt meds include: Statin :Yes Betablocker: No ASA: Yes Other anticoagulants/antiplatelets: Plavix     Past Medical History:  Diagnosis Date  . Anxiety   . Arthritis   . Asthma   . Chronic kidney disease    ckd  . COPD (chronic obstructive pulmonary disease) ([place])   . Depression   . Emphysema of lung ([place])   . GERD (gastroesophageal reflux disease)   . Hepatitis    Hep C per H&P by Dr. Trula Slade  . HIV infection [facility])   . Hyperlipidemia   . Hypertension   . Myocardial infarction   . PAD (peripheral artery disease) ([place])   . Seizures ([place])   . Substance abuse     Social History Social History  Substance Use Topics  . Smoking status: Current Every Day Smoker    Packs/day: 0.25    Years: 46.00    Types: Cigarettes  . Smokeless  tobacco: Never Used     Comment: Ialready have information "  . Alcohol use No    Family History History reviewed. No pertinent family history.  Past Surgical History:  Procedure Laterality Date  . AXILLARY-FEMORAL BYPASS GRAFT Bilateral 06/19/2016   Procedure: LEFT  AXILLO-BIFEMORAL BYPASS GRAFT;  Surgeon: Rosetta Posner, MD;  Location: [place];  Service: Vascular;  Laterality: Bilateral;  . CESAREAN SECTION  X 2  . CORONARY ARTERY BYPASS GRAFT  12/2015   CABG X 3"  . MYRINGOTOMY WITH TUBE PLACEMENT Bilateral "as a child"  . PERIPHERAL VASCULAR CATHETERIZATION Left 06/13/2016   Procedure: Lower Extremity Angiography;  Surgeon: Serafina Mitchell, MD;  Location: [place] [facility];  Service: Cardiovascular;  Laterality: Left;  . STENT PLACEMENT VASCULAR ([place] HX) Left    groin - 2011/2012?  . TONSILLECTOMY     "as a child"  . TUBAL LIGATION    . TUBAL LIGATION      Allergies  Allergen Reactions  . Morphine And Related Itching and Rash    Current Outpatient Prescriptions  Medication Sig Dispense Refill  . abacavir-dolutegravir-lamiVUDine (TRIUMEQ) 600-50-300 MG tablet Take 1 tablet by mouth at bedtime.     Marland Kitchen acetaminophen (TYLENOL) 325 MG tablet Take 650 mg by mouth every 6 (six) hours as needed for mild pain.    Marland Kitchen albuterol (PROAIR HFA) 108 (90 Base) MCG/ACT  inhaler Inhale 2 puffs into the lungs every 6 (six) hours as needed for wheezing or shortness of breath.    Marland Kitchen aspirin EC 81 MG tablet Take 81 mg by mouth daily.    . bisacodyl (DULCOLAX) 5 MG EC tablet Take 1 tablet (5 mg total) by mouth daily as needed for moderate constipation. 30 tablet 0  . Cholecalciferol (VITAMIN D3) 5000 units TABS Take 5,000 Units by mouth daily.    . clopidogrel (PLAVIX) 75 MG tablet Take 75 mg by mouth daily. Reported on 05/15/2016  0  . docusate sodium (COLACE) 100 MG capsule Take 1 capsule (100 mg total) by mouth daily. 10 capsule 0  . doxepin (SINEQUAN) 50 MG capsule Take 50-100 mg by mouth at bedtime.      . DULoxetine (CYMBALTA) 60 MG capsule Take 60 mg by mouth daily.    Marland Kitchen gabapentin (NEURONTIN) 600 MG tablet Take 600 mg by mouth 3 (three) times daily.    Marland Kitchen lidocaine (LIDODERM) 5 % Place 1 patch onto the skin daily. Remove & Discard patch within 12 hours or as directed by MD    . Menthol-Methyl Salicylate (MUSCLE RUB) 10-15 % CREA Apply 1 application topically 2 (two) times daily as needed for muscle pain.    Marland Kitchen OVER THE COUNTER MEDICATION Place 1 drop into both eyes 3 (three) times daily as needed (dry eyes). Over the counter lubricant eye drops    . oxyCODONE (OXY IR/ROXICODONE) 5 MG immediate release tablet Take 5-10 mg by mouth every 6 (six) hours as needed for severe pain (pain).    . ranitidine (ZANTAC) 150 MG tablet Take 150 mg by mouth daily.    . rosuvastatin (CRESTOR) 10 MG tablet Take 10 mg by mouth at bedtime.     Marland Kitchen FLUoxetine (PROZAC) 20 MG tablet Take 20 mg by mouth daily.    . mupirocin ointment (BACTROBAN) 2 % Place 1 application into the nose 2 (two) times daily. (Patient not taking: Reported on 11/14/2016) 22 g 0  . ondansetron (ZOFRAN) 4 MG tablet Take 1 tablet (4 mg total) by mouth every 6 (six) hours as needed for nausea. (Patient not taking: Reported on 11/14/2016) 20 tablet 0  . polyethylene glycol (MIRALAX / GLYCOLAX) packet Take 17 g by mouth daily as needed for mild constipation. (Patient not taking: Reported on 11/14/2016) 14 each 0   No current facility-administered medications for this visit.     ROS: See HPI for pertinent positives and negatives.   Physical Examination  Vitals:   11/14/16 1459  BP: 118/70  Pulse: 77  Resp: 18  Temp: 98.1 F (36.7 C)  TempSrc: Oral  SpO2: 94%  Weight: 143 lb 1.6 oz (64.9 kg)  Height: 5\' 7"  (1.702 m)   Body mass index is 22.41 kg/m.  General: A&O x 3, WDWN. Gait: normal Eyes: PERRLA. Pulmonary: Respirations are non labored, CTAB, fair air movement Cardiac: regular rhythm and rate, no detected murmur.          Carotid Bruits Right Left   Negative Negative  Aorta is not palpable. Radial pulses: 1+ right, faint left, left brachial is 1+ palpable                            VASCULAR EXAM: Extremities without ischemic changes  without Gangrene; without open wounds. Tip of left great toe is slightly ruddy and cool. Fem-fem bypass graft and left ax-fem bypass graft with palpable pulses  LE Pulses Right Left       FEMORAL   palpable   palpable       POPLITEAL  not palpable  not palpable       POSTERIOR TIBIAL   palpable  not palpable       DORSALIS PEDIS      ANTERIOR TIBIAL  not palpable not palpable   Abdomen: soft, NT, no palpable masses. Skin: no rashes, no ulcers. Musculoskeletal: no muscle wasting or atrophy.         Neurologic: A&O X 3; Appropriate Affect ; SENSATION: normal; MOTOR FUNCTION:  moving all extremities equally, motor strength 5/5 throughout. Speech is fluent/normal. CN 2-12 intact.     ASSESSMENT: Sheri Peterson is a 60 y.o. female who is status post left axillary to femoral and femoral-femoral bypass with 8 mm Hemashield graft on 06/19/2016. There are no signs of ischemia in her feet/legs. She does not have claudication with walking.  Her atherosclerotic risk factors include continued smoking (x 46 years) and CAD. Fortunately she does not have DM.  DATA ABI's today demonstrate 1.06 (was 0.87 on 08-11-16) in right LE with bi and monophasic waveforms, TBI is hyperemic at 1.85. Left ABI improved to 0.77 from 0.69 with monophasic waveforms, TBI is normal at 1.28.  PLAN:  The patient was counseled re smoking cessation and given several free resources re smoking cessation. Based on today's non invasive vascular lab results, pt's HPI and physical exam results, I advised pt to return in 6 months with ABI's. I advised pt to  notify us if she develops concerns re the circulation in her feet/legs.   Continue extensive exercise program.  I discussed in depth with the patient the nature of atherosclerosis, and emphasized the importance of maximal medical management including strict control of blood pressure, blood glucose, and lipid levels, obtaining regular exercise, and cessation of smoking.  The patient is aware that without maximal medical management the underlying atherosclerotic disease process will progress, limiting the benefit of any interventions.  The patient was given information about PAD including signs, symptoms, treatment, what symptoms should prompt the patient to seek immediate medical care, and risk reduction measures to take.  Clemon Chambers, RN, MSN, FNP-C [facility] [employer] Phone: [phone]  Clinic MD: Early  11/14/16 3:22 PM

## 2017-02-02 ENCOUNTER — Telehealth: Payer: Self-pay

## 2017-02-02 NOTE — Telephone Encounter (Signed)
Phone call from representative of [facility].  requested pt's f/u appt. be moved-up.  Reported the pt. is complaining of increased pain in right hip and leg.  Unable to answer if the pt's pain is with rest or activity.  Is not aware if the pt. has any other complaints/ problems.  Reported the provider stated the pt. Is not at risk of losing her leg, and can be scheduled at 1st available time. Advised a Scheduler will call back with appt. information.

## 2017-02-02 NOTE — Telephone Encounter (Signed)
appt moved to 4/26

## 2017-02-07 ENCOUNTER — Other Ambulatory Visit: Payer: Self-pay | Admitting: Family

## 2017-02-07 DIAGNOSIS — I739 Peripheral vascular disease, unspecified: Secondary | ICD-10-CM

## 2017-03-14 ENCOUNTER — Encounter: Payer: Self-pay | Admitting: Family

## 2017-03-22 ENCOUNTER — Encounter: Payer: Self-pay | Admitting: Family

## 2017-03-22 ENCOUNTER — Ambulatory Visit (HOSPITAL_COMMUNITY)
Admission: RE | Admit: 2017-03-22 | Discharge: 2017-03-22 | Disposition: A | Discharge status: Home / Self Care | Payer: Medicare (Managed Care) | Source: Ambulatory Visit | Attending: Family | Admitting: Family

## 2017-03-22 ENCOUNTER — Ambulatory Visit (INDEPENDENT_AMBULATORY_CARE_PROVIDER_SITE_OTHER): Payer: Medicare (Managed Care) | Admitting: Family

## 2017-03-22 VITALS — BP 129/82 | HR 71 | Temp 97.0°F | Resp 20 | Ht 67.0 in | Wt 144.0 lb

## 2017-03-22 DIAGNOSIS — I7789 Other specified disorders of arteries and arterioles: Secondary | ICD-10-CM | POA: Diagnosis not present

## 2017-03-22 DIAGNOSIS — I779 Disorder of arteries and arterioles, unspecified: Secondary | ICD-10-CM

## 2017-03-22 DIAGNOSIS — R938 Abnormal findings on diagnostic imaging of other specified body structures: Secondary | ICD-10-CM | POA: Diagnosis not present

## 2017-03-22 DIAGNOSIS — I7409 Other arterial embolism and thrombosis of abdominal aorta: Secondary | ICD-10-CM

## 2017-03-22 DIAGNOSIS — F17200 Nicotine dependence, unspecified, uncomplicated: Secondary | ICD-10-CM

## 2017-03-22 DIAGNOSIS — R0989 Other specified symptoms and signs involving the circulatory and respiratory systems: Secondary | ICD-10-CM | POA: Diagnosis present

## 2017-03-22 NOTE — Patient Instructions (Signed)

## 2017-03-22 NOTE — Progress Notes (Signed)
VASCULAR [facility] VEIN SPECIALISTS OF [place]   CC: Follow up peripheral artery occlusive disease  History of Present Illness Sheri Peterson is a 61 y.o. female who is status post left axillary to femoral and femoral-femoral bypass with 8 mm Hemashield graft by Dr. Donnetta Hutching on 06/19/2016. This was done for critical ischemia of the bilateral lower extremities, left greater than right. The patient states that her left foot numbness has improved although it is still present. The pain in her left foot has resolved.  She complains of left lateral chest discomfort that corresponds to her left axillofemoral graft. She no longer hasbilateral leg swelling.  She has back pain, pt reports that her PCP prescribes her analgesics and psychiatric meds, states her HIV meds are prescribed by ID at [facility].   She reports left foot tingling and right hip pain for the last 2 months. She is scheduled to see [facility] in [place], [facility];'s in the past have helped, states she will get this agin.   She takes Plavix,aspirin and a statin.  She denies tingling, numbness, aching, or cold sensation in either hand/arm.   She is exercising 3 days/week at a program in [facility], states she feels better.   Pt Diabetic: No Pt smoker: smoker (1/2ppd x since age 58yrs), states she has no intention to quit.   Pt meds include: Statin :Yes Betablocker: No ASA: Yes Other anticoagulants/antiplatelets: Plavix    Past Medical History:  Diagnosis Date  . Anxiety   . Arthritis   . Asthma   . Chronic kidney disease    ckd  . COPD (chronic obstructive pulmonary disease) ([place])   . Depression   . Emphysema of lung ([place])   . GERD (gastroesophageal reflux disease)   . Hepatitis    Hep C per H&P by Dr. Trula Slade  . HIV infection [facility])   . Hyperlipidemia   . Hypertension   . Myocardial infarction ([place])   . PAD (peripheral artery disease) ([place])   . Seizures ([place])   . Substance abuse      Social History Social History  Substance Use Topics  . Smoking status: Current Every Day Smoker    Years: 46.00    Types: Cigarettes  . Smokeless tobacco: Never Used     Comment: 1/2 pk per day.   . Alcohol use No    Family History No family history on file.  Past Surgical History:  Procedure Laterality Date  . AXILLARY-FEMORAL BYPASS GRAFT Bilateral 06/19/2016   Procedure: LEFT  AXILLO-BIFEMORAL BYPASS GRAFT;  Surgeon: Rosetta Posner, MD;  Location: [place];  Service: Vascular;  Laterality: Bilateral;  . CESAREAN SECTION  X 2  . CORONARY ARTERY BYPASS GRAFT  12/2015   CABG X 3"  . MYRINGOTOMY WITH TUBE PLACEMENT Bilateral "as a child"  . PERIPHERAL VASCULAR CATHETERIZATION Left 06/13/2016   Procedure: Lower Extremity Angiography;  Surgeon: Serafina Mitchell, MD;  Location: [place] CV [facility];  Service: Cardiovascular;  Laterality: Left;  . STENT PLACEMENT VASCULAR ([place] HX) Left    groin - 2011/2012?  . TONSILLECTOMY     "as a child"  . TUBAL LIGATION    . TUBAL LIGATION      Allergies  Allergen Reactions  . Morphine And Related Itching and Rash    Current Outpatient Prescriptions  Medication Sig Dispense Refill  . abacavir-dolutegravir-lamiVUDine (TRIUMEQ) 600-50-300 MG tablet Take 1 tablet by mouth at bedtime.     Marland Kitchen acetaminophen (TYLENOL) 325 MG tablet Take  650 mg by mouth every 6 (six) hours as needed for mild pain.    Marland Kitchen albuterol (PROAIR HFA) 108 (90 Base) MCG/ACT inhaler Inhale 2 puffs into the lungs every 6 (six) hours as needed for wheezing or shortness of breath.    Marland Kitchen aspirin EC 81 MG tablet Take 81 mg by mouth daily.    . bisacodyl (DULCOLAX) 5 MG EC tablet Take 1 tablet (5 mg total) by mouth daily as needed for moderate constipation. 30 tablet 0  . Cholecalciferol (VITAMIN D3) 5000 units TABS Take 5,000 Units by mouth daily.    . clopidogrel (PLAVIX) 75 MG tablet Take 75 mg by mouth daily. Reported on 05/15/2016  0  . docusate sodium (COLACE) 100 MG capsule Take  1 capsule (100 mg total) by mouth daily. 10 capsule 0  . doxepin (SINEQUAN) 50 MG capsule Take 50-100 mg by mouth at bedtime.     . DULoxetine (CYMBALTA) 60 MG capsule Take 60 mg by mouth daily.    Marland Kitchen lidocaine (LIDODERM) 5 % Place 1 patch onto the skin daily. Remove & Discard patch within 12 hours or as directed by MD    . OVER THE COUNTER MEDICATION Place 1 drop into both eyes 3 (three) times daily as needed (dry eyes). Over the counter lubricant eye drops    . oxyCODONE (OXY IR/ROXICODONE) 5 MG immediate release tablet Take 5-10 mg by mouth every 6 (six) hours as needed for severe pain (pain).    . ranitidine (ZANTAC) 150 MG tablet Take 150 mg by mouth daily.    . rosuvastatin (CRESTOR) 10 MG tablet Take 10 mg by mouth at bedtime.      No current facility-administered medications for this visit.     ROS: See HPI for pertinent positives and negatives.   Physical Examination  Vitals:   03/22/17 1349  BP: 129/82  Pulse: 71  Resp: 20  Temp: 97 F (36.1 C)  TempSrc: Oral  SpO2: 97%  Weight: 144 lb (65.3 kg)  Height: 5\' 7"  (1.702 m)   Body mass index is 22.55 kg/m.  General: A&O x 3, WDWN. Gait: normal Eyes: PERRLA. Pulmonary: Respirations are non labored, CTAB, fairair movement Cardiac: regular rhythm and rate, no detected murmur.    Carotid Bruits Right Left   Negative Negative  Aorta is notpalpable. Radial pulses: 1+ right, faint left, left brachial is 1+ palpable   VASCULAR EXAM: Extremitieswithoutischemic changes withoutGangrene; withoutopen wounds.   Fem-fem bypass graft and left ax-fem bypass graft with palpable pulses  LE Pulses Right Left  FEMORAL palpable palpable  POPLITEAL notpalpable notpalpable  POSTERIOR TIBIAL palpable notpalpable  DORSALIS PEDIS ANTERIOR TIBIAL not palpable notpalpable   Abdomen: soft, NT, no palpable masses. Skin: no rashes, no  ulcers. Musculoskeletal: no muscle wasting or atrophy. Neurologic: A&O X 3; Appropriate Affect ; SENSATION: normal; MOTOR FUNCTION: moving all extremities equally, motor strength 5/5 throughout. Speech is fluent/normal. CN 2-12 intact.     ASSESSMENT: KEMISHA BONNETTE is a 61 y.o. female who is status post left axillary to femoral and femoral-femoral bypass with 8 mm Hemashield graft on 06/19/2016. There are no signs of ischemia in her feet/legs. She does not have claudication with walking.  Her atherosclerotic risk factors include continued smoking (x 47 years) and CAD. She is adamant that she has no intention to quit smoking.  Fortunately she does not have DM.  DATA (03/22/17): ABI:  Right: 1.09 (1.06 on 11-14-16), waveforms: PT: biphasic, DP: monophasic, TBI: 0.93 (was 1.85)  Left: 0.77 (0.77, 11-14-16), waveforms: monophasic;TBI: 0.62 (was 1.28). Stable bilateral ABI: no evidence of arterial occlusive disease in the right lower extremity, moderate in the left.  PLAN:  The patient declined information re smoking cessation. Based on today's non invasive vascular lab results, pt's HPI and physical exam results, I advised pt to return in 6 months with ABI's. Pt requested that she see Dr. Donnetta Hutching on her return; she stated "I don't like Nurse Practitioners".  I advised pt to notify us if she develops concerns re the circulation in her feet/legs.   Continue extensive exercise program.  I discussed in depth with the patient the nature of atherosclerosis, and emphasized the importance of maximal medical management including strict control of blood pressure, blood glucose, and lipid levels, obtaining regular exercise, and cessation of smoking.  The patient is aware that without maximal medical management the underlying atherosclerotic disease process will progress, limiting the benefit of any interventions.  The patient was given information about PAD including signs, symptoms,  treatment, what symptoms should prompt the patient to seek immediate medical care, and risk reduction measures to take.  Clemon Chambers, RN, MSN, FNP-C [facility] Phone: [phone]  Clinic MD: Bridgett Larsson on call  03/22/17 1:58 PM

## 2017-03-23 NOTE — Addendum Note (Signed)
Addended by: Lianne Cure A on: 03/23/2017 09:01 AM   Modules accepted: Orders

## 2017-04-12 DIAGNOSIS — B20 Human immunodeficiency virus [HIV] disease: Secondary | ICD-10-CM | POA: Diagnosis not present

## 2017-05-16 ENCOUNTER — Encounter (HOSPITAL_COMMUNITY): Payer: Medicare (Managed Care)

## 2017-05-16 ENCOUNTER — Ambulatory Visit: Payer: Medicare (Managed Care) | Admitting: Family

## 2017-06-18 ENCOUNTER — Encounter: Payer: Self-pay | Admitting: Vascular Surgery

## 2017-07-03 ENCOUNTER — Ambulatory Visit (HOSPITAL_COMMUNITY)
Admission: RE | Admit: 2017-07-03 | Discharge: 2017-07-03 | Disposition: A | Discharge status: Home / Self Care | Payer: Medicare (Managed Care) | Source: Ambulatory Visit | Attending: Vascular Surgery | Admitting: Vascular Surgery

## 2017-07-03 ENCOUNTER — Ambulatory Visit (INDEPENDENT_AMBULATORY_CARE_PROVIDER_SITE_OTHER): Payer: Medicare (Managed Care) | Admitting: Vascular Surgery

## 2017-07-03 ENCOUNTER — Encounter: Payer: Self-pay | Admitting: Vascular Surgery

## 2017-07-03 VITALS — BP 116/72 | HR 64 | Temp 97.5°F | Resp 16 | Ht 67.0 in | Wt 143.0 lb

## 2017-07-03 DIAGNOSIS — I739 Peripheral vascular disease, unspecified: Secondary | ICD-10-CM

## 2017-07-03 DIAGNOSIS — Z720 Tobacco use: Secondary | ICD-10-CM | POA: Diagnosis not present

## 2017-07-03 DIAGNOSIS — I7409 Other arterial embolism and thrombosis of abdominal aorta: Secondary | ICD-10-CM

## 2017-07-03 DIAGNOSIS — R938 Abnormal findings on diagnostic imaging of other specified body structures: Secondary | ICD-10-CM | POA: Diagnosis not present

## 2017-07-03 DIAGNOSIS — I10 Essential (primary) hypertension: Secondary | ICD-10-CM | POA: Insufficient documentation

## 2017-07-03 DIAGNOSIS — E785 Hyperlipidemia, unspecified: Secondary | ICD-10-CM | POA: Diagnosis not present

## 2017-07-03 DIAGNOSIS — I779 Disorder of arteries and arterioles, unspecified: Secondary | ICD-10-CM

## 2017-07-03 DIAGNOSIS — R0989 Other specified symptoms and signs involving the circulatory and respiratory systems: Secondary | ICD-10-CM | POA: Diagnosis present

## 2017-07-03 NOTE — Progress Notes (Signed)
Vascular and Vein Specialist of [place]  Patient name: Sheri Peterson MRN: [id number] DOB: 1956-03-02 Sex: female  REASON FOR VISIT: Here today for follow-up of left axillary to femoral and femorofemoral bypasses and urgency and July 2017. She presented with critical limb ischemia that is been progressive for quite some time particularly in her left leg. She underwent an axillofemoral femorofemoral bypass. She has had persistent numbness in her left foot with no tissue loss. His multiple other medical problems. These are outlined below.  HPI: Sheri Peterson is a 61 y.o. female she does not have any claudication type symptoms. Does have some numbness and tingling that persist in her left foot. No new cardiac difficulties. She does report some tenderness over her axillofemoral bypass at the level of her chest wall on the left  Past Medical History:  Diagnosis Date  . Anxiety   . Arthritis   . Asthma   . Chronic kidney disease    ckd  . COPD (chronic obstructive pulmonary disease) ([place])   . Depression   . Emphysema of lung ([place])   . GERD (gastroesophageal reflux disease)   . Hepatitis    Hep C per H&P by Dr. Trula Slade  . HIV infection [facility])   . Hyperlipidemia   . Hypertension   . Myocardial infarction ([place])   . PAD (peripheral artery disease) ([place])   . Seizures ([place])   . Substance abuse     No family history on file.  SOCIAL HISTORY: Social History  Substance Use Topics  . Smoking status: Current Every Day Smoker    Years: 46.00    Types: Cigarettes  . Smokeless tobacco: Never Used     Comment: 1/2 pk per day.   . Alcohol use No    Allergies  Allergen Reactions  . Morphine And Related Itching and Rash    Current Outpatient Prescriptions  Medication Sig Dispense Refill  . abacavir-dolutegravir-lamiVUDine (TRIUMEQ) 600-50-300 MG tablet Take 1 tablet by mouth at bedtime.     Marland Kitchen acetaminophen (TYLENOL) 325 MG tablet Take 650 mg by  mouth every 6 (six) hours as needed for mild pain.    Marland Kitchen albuterol (PROAIR HFA) 108 (90 Base) MCG/ACT inhaler Inhale 2 puffs into the lungs every 6 (six) hours as needed for wheezing or shortness of breath.    . ARIPiprazole (ABILIFY) 10 MG tablet Take 10 mg by mouth daily.    Marland Kitchen aspirin EC 81 MG tablet Take 81 mg by mouth daily.    . bisacodyl (DULCOLAX) 5 MG EC tablet Take 1 tablet (5 mg total) by mouth daily as needed for moderate constipation. 30 tablet 0  . Cholecalciferol (VITAMIN D3) 5000 units TABS Take 5,000 Units by mouth daily.    . clopidogrel (PLAVIX) 75 MG tablet Take 75 mg by mouth daily. Reported on 05/15/2016  0  . doxepin (SINEQUAN) 50 MG capsule Take 50-100 mg by mouth at bedtime.     Marland Kitchen FLUoxetine (PROZAC) 20 MG tablet Take 20 mg by mouth daily.    Marland Kitchen lidocaine (LIDODERM) 5 % Place 1 patch onto the skin daily. Remove & Discard patch within 12 hours or as directed by MD    . OVER THE COUNTER MEDICATION Place 1 drop into both eyes 3 (three) times daily as needed (dry eyes). Over the counter lubricant eye drops    . oxyCODONE (OXY IR/ROXICODONE) 5 MG immediate release tablet Take 5-10 mg by mouth every 6 (six) hours as needed for severe pain (  pain).    . ranitidine (ZANTAC) 150 MG tablet Take 150 mg by mouth daily.    . rosuvastatin (CRESTOR) 10 MG tablet Take 10 mg by mouth at bedtime.      No current facility-administered medications for this visit.     REVIEW OF SYSTEMS:  [X]  denotes positive finding, [ ]  denotes negative finding Cardiac  Comments:  Chest pain or chest pressure:    Shortness of breath upon exertion:    Short of breath when lying flat:    Irregular heart rhythm:        Vascular    Pain in calf, thigh, or hip brought on by ambulation: x   Pain in feet at night that wakes you up from your sleep:     Blood clot in your veins:    Leg swelling:           PHYSICAL EXAM: Vitals:   07/03/17 1058  BP: 116/72  Pulse: 64  Resp: 16  Temp: (!) 97.5 F (36.4  C)  TempSrc: Oral  SpO2: 94%  Weight: 143 lb (64.9 kg)  Height: 5\' 7"  (1.702 m)    GENERAL: The patient is a well-nourished female, in no acute distress. The vital signs are documented above. CARDIOVASCULAR: Easily palpable axillofemoral and femorofemoral graft pulses. 2+ femoral pulses bilaterally. No evidence of erythema or fluid collection around her asked him bypass with well-healed incisions. PULMONARY: There is good air exchange  MUSCULOSKELETAL: There are no major deformities or cyanosis. NEUROLOGIC: No focal weakness or paresthesias are detected. SKIN: There are no ulcers or rashes noted. PSYCHIATRIC: The patient has a normal affect.  DATA:  Noninvasive studies reveal ankle arm index of 0.9 6 on the right and 0.61 on the left.  MEDICAL ISSUES: Stable overall. Encouraged her to continue her walking program is much as possible. Explained that she in all likelihood will have some persistent numbness in her foot related to her prolonged ischemia. We'll see her in one year with repeat noninvasive studies. Patient does not want to see advanced practice providers stating that she only sees MDs and follow-up.    Rosetta Posner, MD FACS [facility] Tel [phone] Pager [phone]

## 2017-07-04 NOTE — Addendum Note (Signed)
Addended by: Lianne Cure A on: 07/04/2017 02:07 PM   Modules accepted: Orders

## 2017-08-18 DIAGNOSIS — B1920 Unspecified viral hepatitis C without hepatic coma: Secondary | ICD-10-CM | POA: Diagnosis not present

## 2017-08-18 DIAGNOSIS — F17200 Nicotine dependence, unspecified, uncomplicated: Secondary | ICD-10-CM | POA: Diagnosis not present

## 2017-08-18 DIAGNOSIS — E785 Hyperlipidemia, unspecified: Secondary | ICD-10-CM | POA: Diagnosis not present

## 2017-08-18 DIAGNOSIS — J449 Chronic obstructive pulmonary disease, unspecified: Secondary | ICD-10-CM | POA: Diagnosis not present

## 2017-08-18 DIAGNOSIS — B20 Human immunodeficiency virus [HIV] disease: Secondary | ICD-10-CM | POA: Diagnosis not present

## 2017-08-18 DIAGNOSIS — I2510 Atherosclerotic heart disease of native coronary artery without angina pectoris: Secondary | ICD-10-CM | POA: Diagnosis not present

## 2017-08-18 DIAGNOSIS — Z87898 Personal history of other specified conditions: Secondary | ICD-10-CM | POA: Diagnosis not present

## 2017-08-18 DIAGNOSIS — I6312 Cerebral infarction due to embolism of basilar artery: Secondary | ICD-10-CM

## 2017-08-18 DIAGNOSIS — Z951 Presence of aortocoronary bypass graft: Secondary | ICD-10-CM | POA: Diagnosis not present

## 2017-08-18 DIAGNOSIS — I739 Peripheral vascular disease, unspecified: Secondary | ICD-10-CM

## 2017-08-19 DIAGNOSIS — B1920 Unspecified viral hepatitis C without hepatic coma: Secondary | ICD-10-CM | POA: Diagnosis not present

## 2017-08-19 DIAGNOSIS — Z951 Presence of aortocoronary bypass graft: Secondary | ICD-10-CM | POA: Diagnosis not present

## 2017-08-19 DIAGNOSIS — I2510 Atherosclerotic heart disease of native coronary artery without angina pectoris: Secondary | ICD-10-CM | POA: Diagnosis not present

## 2017-08-19 DIAGNOSIS — E785 Hyperlipidemia, unspecified: Secondary | ICD-10-CM | POA: Diagnosis not present

## 2017-08-19 DIAGNOSIS — B20 Human immunodeficiency virus [HIV] disease: Secondary | ICD-10-CM | POA: Diagnosis not present

## 2017-08-19 DIAGNOSIS — I739 Peripheral vascular disease, unspecified: Secondary | ICD-10-CM | POA: Diagnosis not present

## 2017-08-19 DIAGNOSIS — Z87898 Personal history of other specified conditions: Secondary | ICD-10-CM | POA: Diagnosis not present

## 2017-08-19 DIAGNOSIS — J449 Chronic obstructive pulmonary disease, unspecified: Secondary | ICD-10-CM | POA: Diagnosis not present

## 2017-08-19 DIAGNOSIS — F17200 Nicotine dependence, unspecified, uncomplicated: Secondary | ICD-10-CM | POA: Diagnosis not present

## 2017-08-19 DIAGNOSIS — I6312 Cerebral infarction due to embolism of basilar artery: Secondary | ICD-10-CM | POA: Diagnosis not present

## 2017-08-19 DIAGNOSIS — I6789 Other cerebrovascular disease: Secondary | ICD-10-CM

## 2017-08-20 DIAGNOSIS — I6789 Other cerebrovascular disease: Secondary | ICD-10-CM | POA: Diagnosis not present

## 2017-08-20 DIAGNOSIS — E785 Hyperlipidemia, unspecified: Secondary | ICD-10-CM | POA: Diagnosis not present

## 2017-08-20 DIAGNOSIS — I739 Peripheral vascular disease, unspecified: Secondary | ICD-10-CM | POA: Diagnosis not present

## 2017-08-20 DIAGNOSIS — B1920 Unspecified viral hepatitis C without hepatic coma: Secondary | ICD-10-CM | POA: Diagnosis not present

## 2017-08-20 DIAGNOSIS — I6312 Cerebral infarction due to embolism of basilar artery: Secondary | ICD-10-CM | POA: Diagnosis not present

## 2018-01-25 DEATH — deceased

## 2018-05-18 IMAGING — CT CT ANGIO CHEST-ABD-PELV FOR DISSECTION W/ AND WO/W CM
2 of 7 series · 11 of 46 positions shown, 12 images · IV contrast (OMNI 350)
Comparison: 01/28/2016

CLINICAL DATA: Peripheral vascular disease. Aortobifemoral versus
axillo-bifemoral bypass graft.

EXAM:
CT ANGIOGRAPHY CHEST, ABDOMEN AND PELVIS
TECHNIQUE: Multidetector CT imaging through the chest, abdomen and pelvis was
performed using the standard protocol during bolus administration of
intravenous contrast. Multiplanar reconstructed images and MIPs were
obtained and reviewed to evaluate the vascular anatomy.
CONTRAST:  100 cc Isovue 370

[Series 6: dissection 2mm · axial · 0.69mm/px · z∈[+303,+847]mm · 8 of 340 slices shown, 9 images]
[im 34/340  soft-tissue]
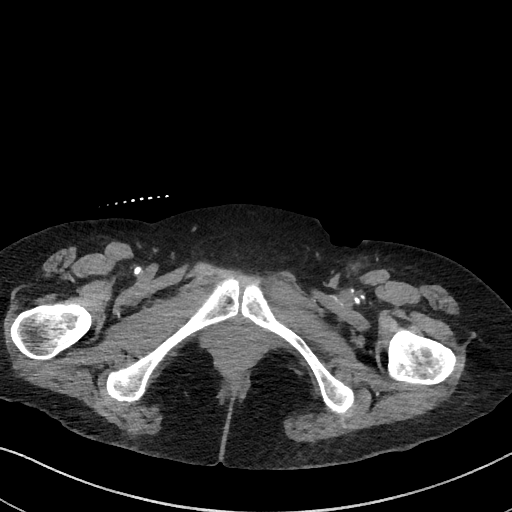
[im 34/340  bone]
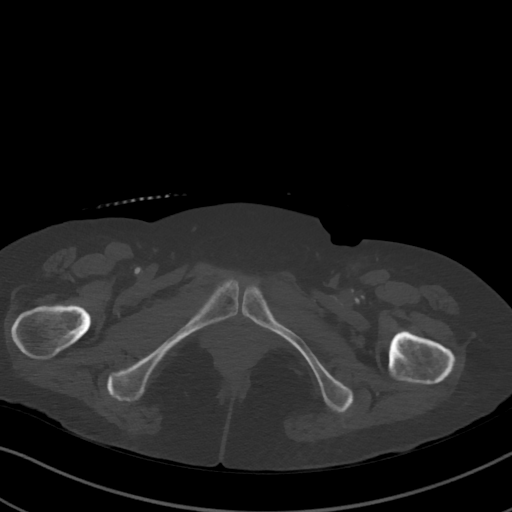
[im 68/340  soft-tissue]
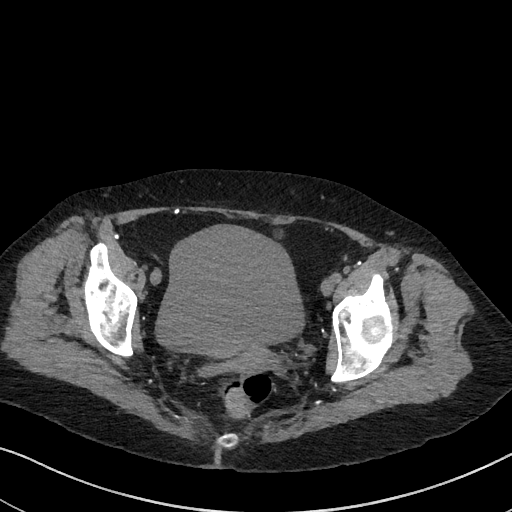
[im 102/340  soft-tissue]
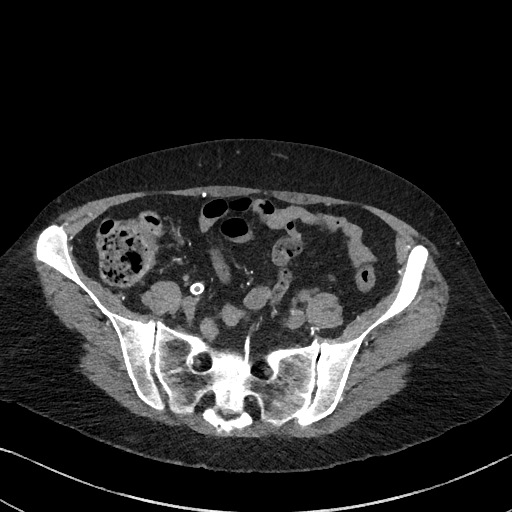
[im 153/340  soft-tissue]
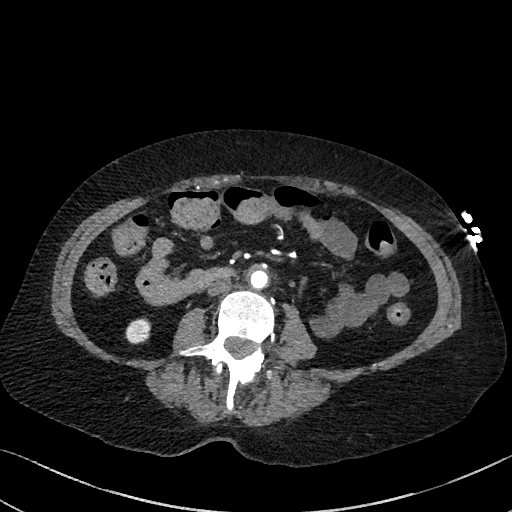
[im 187/340  soft-tissue]
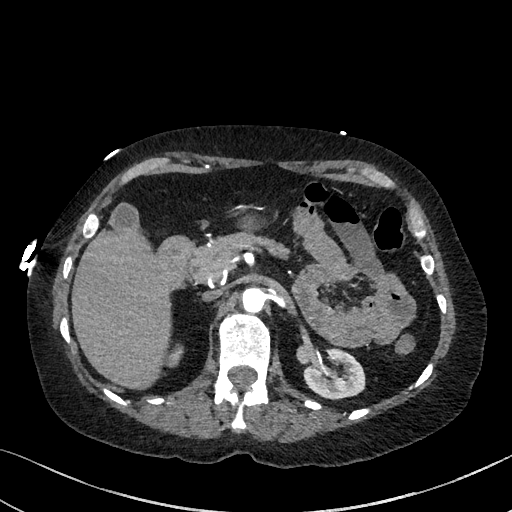
[im 238/340  soft-tissue]
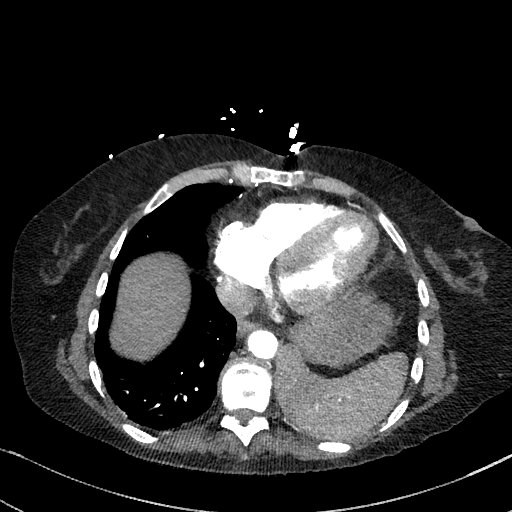
[im 272/340  soft-tissue]
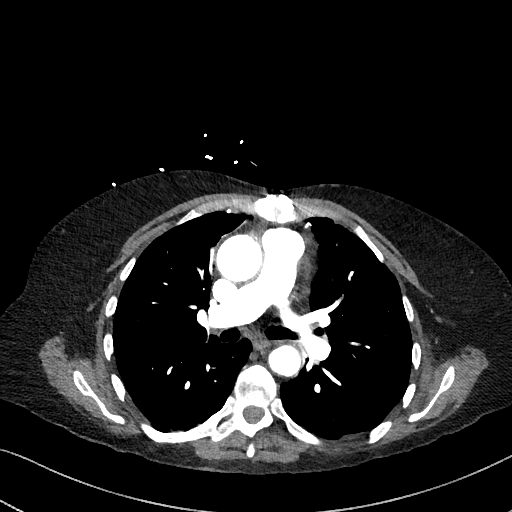
[im 306/340  soft-tissue]
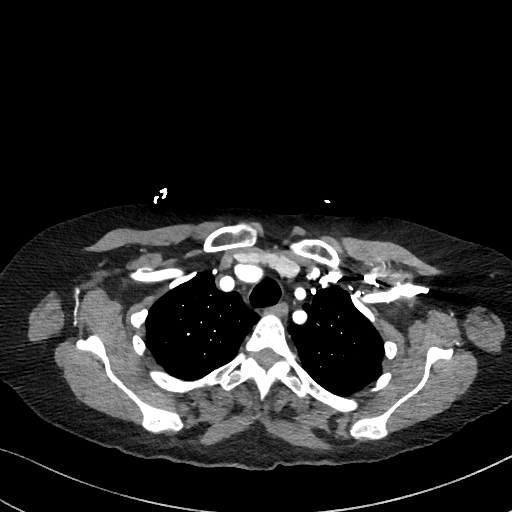

[Series 9: dissection 2mm cor · coronal · 0.60mm/px · 3 of 147 slices shown]
[im 37/147  soft-tissue]
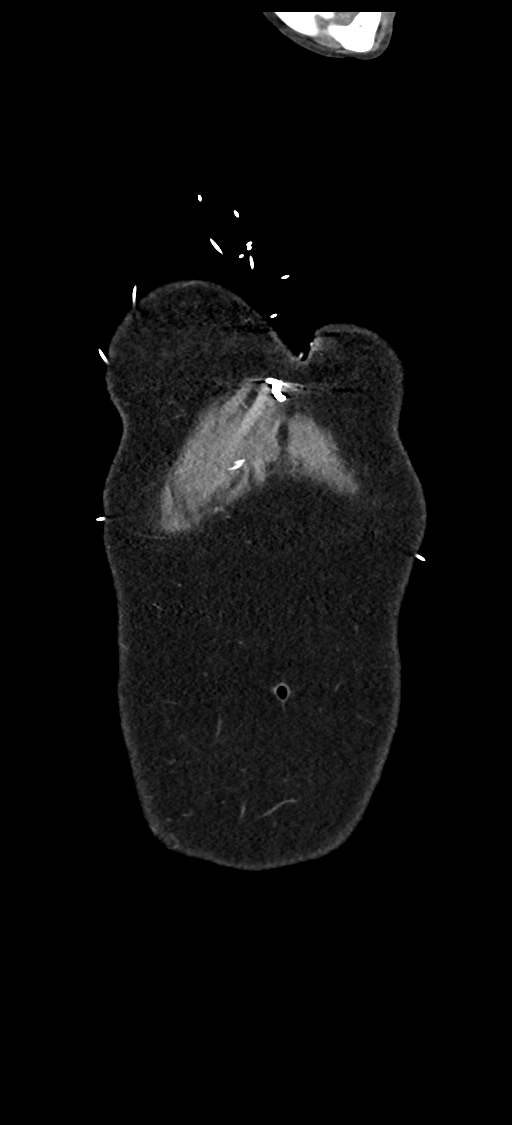
[im 74/147  soft-tissue]
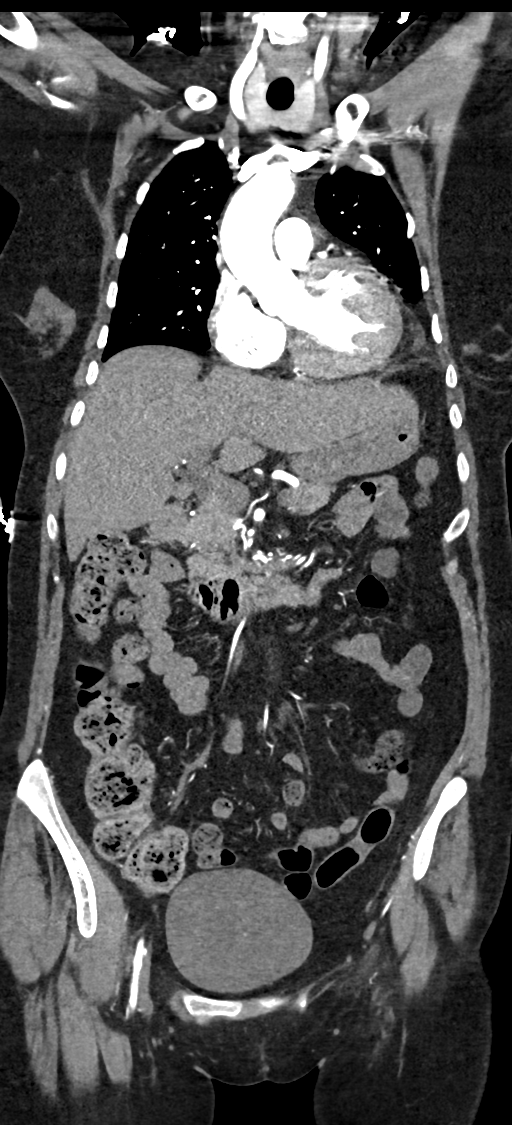
[im 110/147  soft-tissue]
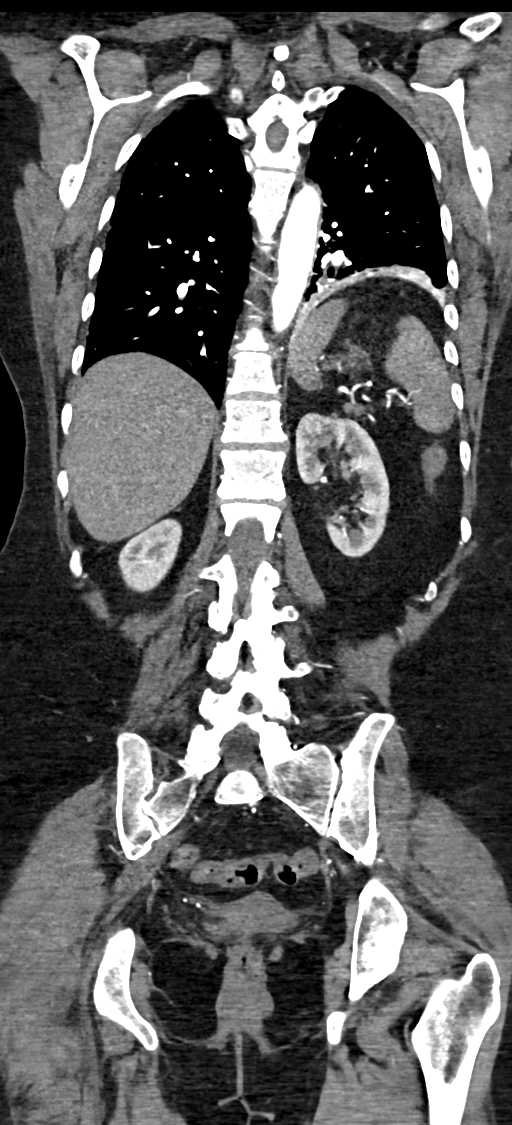

[11 of 46 positions shown; findings below may reference images not displayed]

FINDINGS: CTA CHEST FINDINGS

There is no evidence of aortic dissection or intramural hematoma.
There is smooth bus circumferential soft plaque involving the distal
aortic arch and descending thoracic aorta. Atherosclerotic
calcifications in the arch are noted. Maximal diameter of the
ascending aorta is 3.3 cm. The arch and descending aorta are non
aneurysmal. The innominate artery is 1.7 cm in caliber. There is
some chronic mural thrombus with calcification along the wall. It is
patent. Right subclavian and common carotid arteries are patent.
Right vertebral artery is patent. There is mild narrowing of the
right subclavian artery at the thoracic outlet. Left common carotid
artery via bovine origin is widely patent. There is some irregular
plaque at the origin of the left subclavian artery without
significant focal narrowing. The left vertebral artery does not
opacified. It is likely occluded. Left axillary artery is patent. No
filling defect is present in the pulmonary arterial tree to suggest
acute pulmonary thromboembolism. Postoperative changes from
sternotomy are noted. Internal mammary bypass graft is suspected.
There is no abnormal mediastinal adenopathy. Small mediastinal nodes
are stable. No pericardial effusion. Normal thyroid gland. No
pneumothorax or pleural effusion. There is chronic atelectasis in
the posterior basal segment of the left lower lobe. Minimal
dependent atelectasis at the right base. The left hemidiaphragm is
elevated. No acute bony deformity. Advanced degenerative disc
disease at C5-6 and C6-7 is present. No vertebral compression
deformity in the thoracic spine. T12 hemangioma

Review of the MIP images confirms the above findings.

CTA ABDOMEN AND PELVIS FINDINGS

Aorta is non aneurysmal and patent. There is no evidence of
dissection or intramural hematoma. Smooth circumferential plaque
along the aorta is noted. The distal aorta is occluded just above
the bifurcation. Compared to the prior study, there is some
stranding within the fat surrounding the infrarenal aorta. The wall
is also somewhat hazy. These findings suggest an inflammatory
process.

Severe narrowing in the proximal celiac axis likely due to a
combination of atherosclerosis in median arcuate ligament syndrome.
Branch vessels are patent. SMA is patent. IMA is patent. There is
critical narrowing of the right renal artery just beyond its
takeoff. There is mild plaque and narrowing at the origin of the
left renal artery.

The bilateral common iliac and external iliac arteries are occluded.
Tiny internal iliac artery branches reconstitute.

Bilateral common femoral arteries reconstitute and are diminutive.
No significant narrowing in the visualize right common femoral,
superficial femoral, profunda femoral arteries. There is mild
diffuse narrowing of the left common femoral artery. The proximal
left superficial femoral artery is severely narrowed. Left profunda
femoral artery branches are patent.

Liver is unremarkable

There is sludge in the gallbladder.

Spleen, pancreas are within normal limits

Diffuse prominence of the adrenal glands without focal nodule.

Kidneys are within normal limits.

No focal mass in the colon. Normal appendix. No evidence of
small-bowel obstruction.

No free-fluid.  There is no abnormal retroperitoneal adenopathy.

Bladder is distended. Uterus is diminutive. Adnexa are unremarkable.

No vertebral compression deformity. There is degenerative disc
disease with an lumbar spine. It is advanced at L4-5 with vacuum.
Multilevel facet arthropathy is also present.

Review of the MIP images confirms the above findings.
IMPRESSION: Chest:

There is no evidence of thoracic aortic dissection or intramural
hematoma. There is atherosclerotic plaque involving the large and
descending aorta.

There is plaque in the innominate artery without significant
narrowing. It is mildly ectatic at 1.7 cm.

Mild narrowing of the right subclavian artery at the thoracic inlet.
Right axillary artery is patent

Left common carotid artery is patent. Left subclavian and axillary
arteries are patent. The left vertebral artery does not opacify and
may be chronically occluded.

Chronic elevation of left hemidiaphragm with chronic atelectasis in
the posterior basal left lower lobe.

Abdomen:

There is no evidence of aortic aneurysm or dissection. The aorta is
occluded just above the bifurcation. There are inflammatory changes
involving the wall of the distal thoracic aorta and adjacent fat.
Noninfectious and infectious inflammatory disorders are
possibilities. Correlate clinically as for the need for blood
cultures.

Bilateral common and external iliac artery occlusion.

Bilateral common femoral arteries reconstitute.

Significant narrowing at the origin of the left superficial femoral
artery.

Right renal artery stenosis.

Celiac artery stenosis.

## 2018-05-22 IMAGING — CR DG CHEST 1V PORT
1 series · 1 of 1 positions shown · non-contrast
Comparison: 01/25/2016 and prior radiographs

CLINICAL DATA: 60-year-old female -preoperative respiratory
examination prior to extremity procedure/surgery.

EXAM:
PORTABLE CHEST 1 VIEW

[AP]
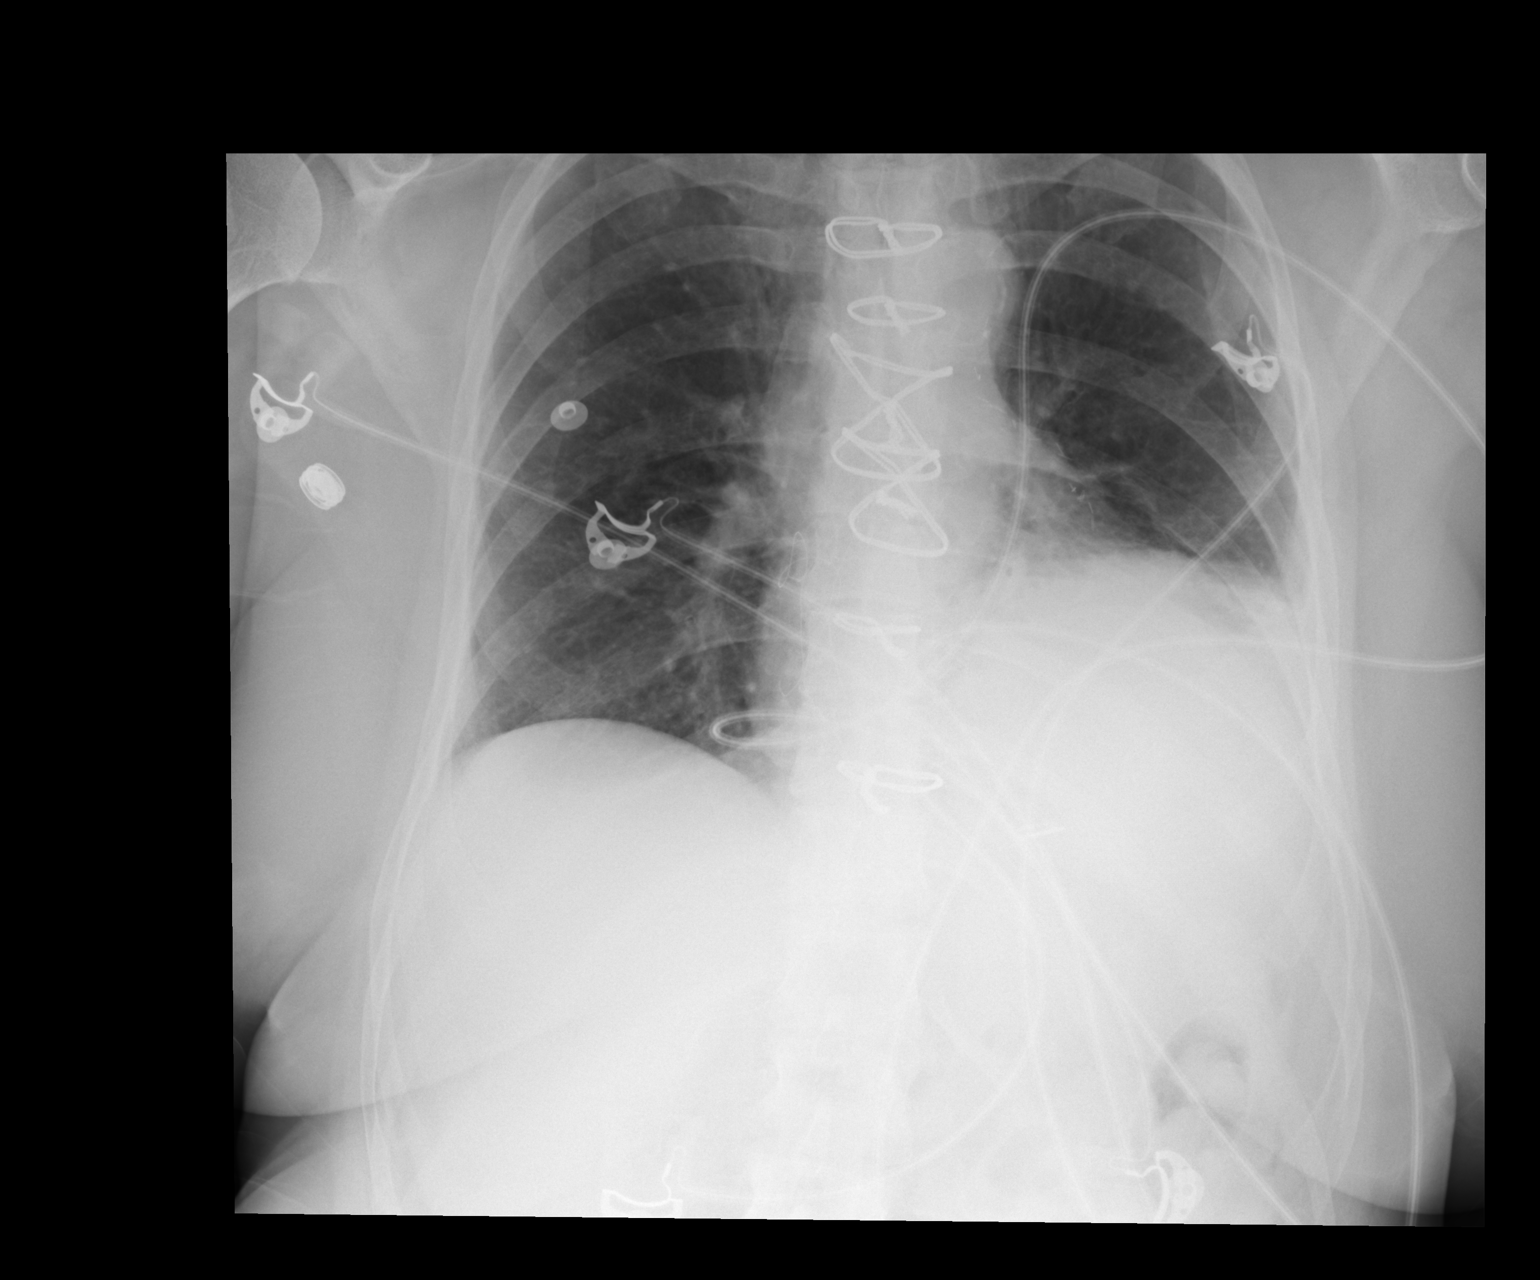

[1 of 1 positions shown; findings below may reference images not displayed]

FINDINGS: Cardiomediastinal silhouette is unchanged with evidence of previous
CABG.

Unchanged left retrocardiac opacity probably represents a
combination of effusion and atelectasis/scarring versus
consolidation.

There is no evidence of pneumothorax.

There is no evidence of right effusion.
IMPRESSION: Left retrocardiac opacity again noted -likely representing
combination of effusion, atelectasis/ scarring and/or consolidation.

No new or acute abnormalities identified otherwise.

## 2018-07-09 ENCOUNTER — Ambulatory Visit (HOSPITAL_COMMUNITY): Payer: Medicare (Managed Care) | Attending: Vascular Surgery

## 2018-07-09 ENCOUNTER — Ambulatory Visit: Payer: Medicare (Managed Care) | Admitting: Family

## 2018-07-09 ENCOUNTER — Encounter: Payer: Self-pay | Admitting: Family
